# Patient Record
Sex: Female | Born: 1985
Health system: Southern US, Community
[De-identification: ages and names within clinical notes are randomized; demographics above are authoritative.]

## PROBLEM LIST (undated history)

## (undated) ENCOUNTER — Inpatient Hospital Stay (HOSPITAL_COMMUNITY): Payer: Self-pay

## (undated) DIAGNOSIS — D509 Iron deficiency anemia, unspecified: Secondary | ICD-10-CM

## (undated) DIAGNOSIS — O4400 Placenta previa specified as without hemorrhage, unspecified trimester: Secondary | ICD-10-CM

## (undated) DIAGNOSIS — Z8619 Personal history of other infectious and parasitic diseases: Secondary | ICD-10-CM

---
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
## 2007-06-06 ENCOUNTER — Emergency Department (HOSPITAL_COMMUNITY): Admission: EM | Admit: 2007-06-06 | Discharge: 2007-06-06 | Payer: Self-pay | Admitting: Emergency Medicine

## 2007-07-12 ENCOUNTER — Other Ambulatory Visit: Admission: RE | Admit: 2007-07-12 | Discharge: 2007-07-12 | Payer: Self-pay | Admitting: Obstetrics and Gynecology

## 2007-08-25 ENCOUNTER — Inpatient Hospital Stay (HOSPITAL_COMMUNITY): Admission: AD | Admit: 2007-08-25 | Discharge: 2007-08-25 | Payer: Self-pay | Admitting: Obstetrics and Gynecology

## 2007-09-08 ENCOUNTER — Inpatient Hospital Stay (HOSPITAL_COMMUNITY): Admission: AD | Admit: 2007-09-08 | Discharge: 2007-09-08 | Payer: Self-pay | Admitting: Obstetrics and Gynecology

## 2007-11-06 ENCOUNTER — Inpatient Hospital Stay (HOSPITAL_COMMUNITY): Admission: AD | Admit: 2007-11-06 | Discharge: 2007-11-19 | Payer: Self-pay | Admitting: *Deleted

## 2007-12-10 ENCOUNTER — Inpatient Hospital Stay (HOSPITAL_COMMUNITY): Admission: AD | Admit: 2007-12-10 | Discharge: 2007-12-10 | Payer: Self-pay | Admitting: Obstetrics and Gynecology

## 2008-09-26 ENCOUNTER — Other Ambulatory Visit: Admission: RE | Admit: 2008-09-26 | Discharge: 2008-09-26 | Payer: Self-pay | Admitting: Family Medicine

## 2009-01-10 ENCOUNTER — Emergency Department (HOSPITAL_BASED_OUTPATIENT_CLINIC_OR_DEPARTMENT_OTHER): Admission: EM | Admit: 2009-01-10 | Discharge: 2009-01-10 | Payer: Self-pay | Admitting: Emergency Medicine

## 2009-02-07 ENCOUNTER — Emergency Department (HOSPITAL_COMMUNITY): Admission: EM | Admit: 2009-02-07 | Discharge: 2009-02-07 | Payer: Self-pay | Admitting: Family Medicine

## 2009-03-13 ENCOUNTER — Emergency Department (HOSPITAL_BASED_OUTPATIENT_CLINIC_OR_DEPARTMENT_OTHER): Admission: EM | Admit: 2009-03-13 | Discharge: 2009-03-13 | Payer: Self-pay | Admitting: Emergency Medicine

## 2009-07-06 ENCOUNTER — Emergency Department (HOSPITAL_COMMUNITY): Admission: EM | Admit: 2009-07-06 | Discharge: 2009-07-06 | Payer: Self-pay | Admitting: Emergency Medicine

## 2009-07-09 ENCOUNTER — Emergency Department (HOSPITAL_COMMUNITY): Admission: EM | Admit: 2009-07-09 | Discharge: 2009-07-09 | Payer: Self-pay | Admitting: Emergency Medicine

## 2010-08-27 LAB — URINALYSIS, ROUTINE W REFLEX MICROSCOPIC
Bilirubin Urine: NEGATIVE
Glucose, UA: NEGATIVE mg/dL
Leukocytes, UA: NEGATIVE
Protein, ur: NEGATIVE mg/dL
Urobilinogen, UA: 2 mg/dL — ABNORMAL HIGH (ref 0.0–1.0)
pH: 7 (ref 5.0–8.0)

## 2010-08-27 LAB — URINE MICROSCOPIC-ADD ON

## 2010-09-05 ENCOUNTER — Inpatient Hospital Stay (INDEPENDENT_AMBULATORY_CARE_PROVIDER_SITE_OTHER)
Admission: RE | Admit: 2010-09-05 | Discharge: 2010-09-05 | Disposition: A | Discharge status: Home / Self Care | Payer: Medicaid Other | Source: Ambulatory Visit | Attending: Emergency Medicine | Admitting: Emergency Medicine

## 2010-09-05 DIAGNOSIS — N760 Acute vaginitis: Secondary | ICD-10-CM

## 2010-09-05 LAB — POCT URINALYSIS DIP (DEVICE)
Ketones, ur: NEGATIVE mg/dL
Nitrite: NEGATIVE
Protein, ur: NEGATIVE mg/dL
pH: 5.5 (ref 5.0–8.0)

## 2010-09-05 LAB — WET PREP, GENITAL
Trich, Wet Prep: NONE SEEN
Yeast Wet Prep HPF POC: NONE SEEN

## 2010-09-05 LAB — POCT PREGNANCY, URINE: Preg Test, Ur: NEGATIVE

## 2010-09-11 LAB — PREGNANCY, URINE: Preg Test, Ur: NEGATIVE

## 2010-09-11 LAB — GC/CHLAMYDIA PROBE AMP, GENITAL: Chlamydia, DNA Probe: NEGATIVE

## 2010-09-12 LAB — URINE CULTURE: Culture: NO GROWTH

## 2010-09-12 LAB — POCT URINALYSIS DIP (DEVICE)
Nitrite: NEGATIVE
Protein, ur: NEGATIVE mg/dL
Urobilinogen, UA: 0.2 mg/dL (ref 0.0–1.0)
pH: 6.5 (ref 5.0–8.0)

## 2010-09-12 LAB — GC/CHLAMYDIA PROBE AMP, GENITAL: Chlamydia, DNA Probe: NEGATIVE

## 2010-09-12 LAB — WET PREP, GENITAL: Clue Cells Wet Prep HPF POC: NONE SEEN

## 2010-10-21 NOTE — Discharge Summary (Signed)
NAMEAUTYMN, Audrey Dougherty                ACCOUNT NO.:  [id number]   MEDICAL RECORD NO.:  [id number]           PATIENT TYPE:   LOCATION:                                 FACILITY:   PHYSICIAN:  Charles A. Delcambre, MDDATE OF BIRTH:  June 07, 1986   DATE OF ADMISSION:  11/07/2007  DATE OF DISCHARGE:  11/19/2007                               DISCHARGE SUMMARY   PRIMARY DISCHARGE DIAGNOSES:  1. Intrauterine pregnancy, 28 weeks 3 days.  2. Placenta previa, one significant bleed.   PROCEDURE:  Hospitalization and bed rest.   HISTORY AND PHYSICAL:  As written on the chart.   LABORATORY DATA:  1-hour glucose 136, hemoglobin 12.0, and hematocrit  35.8. GC/chlamydia negative.   DISPOSITION:  The patient was discharged home per her request and my  consent after strict counseling as she has not had any bleeding for 14  days and has been at bed rest.  She agrees to be on bed rest at home,  with strict precautions to come to the hospital with any bleeding  whatsoever or contraction onset greater than 2 per hour.   HOSPITAL COURSE:  She is instructed to bed rest with bathroom privileges  at home, strictly so, as she had been treated here in the hospital.  She  was kept on monitor for several days with a fetal heart rate and then  this was changed to 30 minutes per shift.  Each shift has had a reactive  fetal heart rate and therefore she was allowed to have one wheelchair  ride per day in-house and then 1 to 2 wheelchair rides per house for the  last several days.  She is up to the bathroom, has had no bleeding, and  with her request and my consent she is discharged home today on bed rest  and pelvic rest.      Charles A. Sydnee Cabal, MD  Electronically Signed     CAD/MEDQ  D:  11/19/2007  T:  11/20/2007  Job:  [id number]

## 2010-12-16 ENCOUNTER — Emergency Department (HOSPITAL_COMMUNITY)
Admission: EM | Admit: 2010-12-16 | Discharge: 2010-12-16 | Disposition: A | Discharge status: Home / Self Care | Payer: Medicaid Other | Attending: Emergency Medicine | Admitting: Emergency Medicine

## 2010-12-16 DIAGNOSIS — N764 Abscess of vulva: Secondary | ICD-10-CM | POA: Insufficient documentation

## 2011-03-02 LAB — URINALYSIS, ROUTINE W REFLEX MICROSCOPIC
Hgb urine dipstick: NEGATIVE
Nitrite: NEGATIVE
Specific Gravity, Urine: 1.01
Urobilinogen, UA: 0.2
pH: 7

## 2011-03-04 LAB — URINALYSIS, ROUTINE W REFLEX MICROSCOPIC
Nitrite: NEGATIVE
Protein, ur: NEGATIVE
Urobilinogen, UA: 0.2

## 2011-03-04 LAB — CBC
MCHC: 33.5
RBC: 4.19
RDW: 12.9

## 2011-03-04 LAB — TYPE AND SCREEN
ABO/RH(D): O POS
Antibody Screen: NEGATIVE

## 2011-03-04 LAB — DIFFERENTIAL
Basophils Absolute: 0
Basophils Relative: 0
Lymphocytes Relative: 16
Neutro Abs: 11.7 — ABNORMAL HIGH
Neutrophils Relative %: 72

## 2011-03-04 LAB — RPR: RPR Ser Ql: NONREACTIVE

## 2011-03-05 LAB — URINALYSIS, ROUTINE W REFLEX MICROSCOPIC
Bilirubin Urine: NEGATIVE
Nitrite: NEGATIVE
Specific Gravity, Urine: 1.015
Urobilinogen, UA: 0.2

## 2011-03-05 LAB — TYPE AND SCREEN
ABO/RH(D): O POS
ABO/RH(D): O POS
ABO/RH(D): O POS
ABO/RH(D): O POS
Antibody Screen: NEGATIVE
Antibody Screen: NEGATIVE

## 2011-03-05 LAB — CBC
MCHC: 33.4
RBC: 4.12
RDW: 13.1

## 2011-03-05 LAB — GC/CHLAMYDIA PROBE AMP, URINE
Chlamydia, Swab/Urine, PCR: NEGATIVE
GC Probe Amp, Urine: NEGATIVE

## 2011-03-05 LAB — HEMOGLOBIN AND HEMATOCRIT, BLOOD
HCT: 34.1 — ABNORMAL LOW
Hemoglobin: 11.7 — ABNORMAL LOW

## 2011-03-13 LAB — GC/CHLAMYDIA PROBE AMP, GENITAL
Chlamydia, DNA Probe: NEGATIVE
GC Probe Amp, Genital: NEGATIVE

## 2011-03-13 LAB — URINALYSIS, ROUTINE W REFLEX MICROSCOPIC
Bilirubin Urine: NEGATIVE
Glucose, UA: NEGATIVE
Hgb urine dipstick: NEGATIVE
Ketones, ur: NEGATIVE
Protein, ur: NEGATIVE

## 2011-03-13 LAB — CBC
Hemoglobin: 12.5
RBC: 4.44
WBC: 11 — ABNORMAL HIGH

## 2011-03-13 LAB — DIFFERENTIAL
Lymphocytes Relative: 15
Monocytes Absolute: 0.6
Monocytes Relative: 6
Neutro Abs: 8.3 — ABNORMAL HIGH

## 2011-03-13 LAB — WET PREP, GENITAL

## 2011-03-13 LAB — ABO/RH: ABO/RH(D): O POS

## 2011-05-06 ENCOUNTER — Encounter: Payer: Self-pay | Admitting: *Deleted

## 2011-05-06 ENCOUNTER — Emergency Department (HOSPITAL_BASED_OUTPATIENT_CLINIC_OR_DEPARTMENT_OTHER)
Admission: EM | Admit: 2011-05-06 | Discharge: 2011-05-06 | Disposition: A | Discharge status: Home / Self Care | Payer: Medicaid Other | Attending: Emergency Medicine | Admitting: Emergency Medicine

## 2011-05-06 ENCOUNTER — Emergency Department (INDEPENDENT_AMBULATORY_CARE_PROVIDER_SITE_OTHER): Payer: Medicaid Other

## 2011-05-06 ENCOUNTER — Emergency Department (HOSPITAL_COMMUNITY)
Admission: EM | Admit: 2011-05-06 | Discharge: 2011-05-06 | Discharge status: Absent without leave | Payer: Self-pay | Source: Home / Self Care

## 2011-05-06 DIAGNOSIS — K5289 Other specified noninfective gastroenteritis and colitis: Secondary | ICD-10-CM | POA: Insufficient documentation

## 2011-05-06 DIAGNOSIS — R509 Fever, unspecified: Secondary | ICD-10-CM

## 2011-05-06 DIAGNOSIS — K529 Noninfective gastroenteritis and colitis, unspecified: Secondary | ICD-10-CM

## 2011-05-06 DIAGNOSIS — R05 Cough: Secondary | ICD-10-CM

## 2011-05-06 DIAGNOSIS — R197 Diarrhea, unspecified: Secondary | ICD-10-CM | POA: Insufficient documentation

## 2011-05-06 DIAGNOSIS — R112 Nausea with vomiting, unspecified: Secondary | ICD-10-CM | POA: Insufficient documentation

## 2011-05-06 DIAGNOSIS — R079 Chest pain, unspecified: Secondary | ICD-10-CM

## 2011-05-06 DIAGNOSIS — R059 Cough, unspecified: Secondary | ICD-10-CM | POA: Insufficient documentation

## 2011-05-06 LAB — DIFFERENTIAL
Band Neutrophils: 0 % (ref 0–10)
Eosinophils Absolute: 0.6 10*3/uL (ref 0.0–0.7)
Eosinophils Relative: 8 % — ABNORMAL HIGH (ref 0–5)
Lymphocytes Relative: 33 % (ref 12–46)
Lymphs Abs: 2.4 10*3/uL (ref 0.7–4.0)
Metamyelocytes Relative: 0 %
Monocytes Absolute: 0.6 10*3/uL (ref 0.1–1.0)
Monocytes Relative: 8 % (ref 3–12)
nRBC: 0 /100 WBC

## 2011-05-06 LAB — URINALYSIS, ROUTINE W REFLEX MICROSCOPIC
Bilirubin Urine: NEGATIVE
Hgb urine dipstick: NEGATIVE
Ketones, ur: NEGATIVE mg/dL
Nitrite: NEGATIVE
Protein, ur: NEGATIVE mg/dL
Specific Gravity, Urine: 1.03 (ref 1.005–1.030)
Urobilinogen, UA: 1 mg/dL (ref 0.0–1.0)

## 2011-05-06 LAB — URINE MICROSCOPIC-ADD ON

## 2011-05-06 LAB — CBC
HCT: 38.6 % (ref 36.0–46.0)
MCV: 79.4 fL (ref 78.0–100.0)
Platelets: 234 10*3/uL (ref 150–400)
RBC: 4.86 MIL/uL (ref 3.87–5.11)
WBC: 7.2 10*3/uL (ref 4.0–10.5)

## 2011-05-06 LAB — BASIC METABOLIC PANEL
BUN: 12 mg/dL (ref 6–23)
Calcium: 9.1 mg/dL (ref 8.4–10.5)
GFR calc Af Amer: >90 mL/min (ref >90)
GFR calc non Af Amer: >90 mL/min (ref >90)
Glucose, Bld: 93 mg/dL (ref 70–99)
Potassium: 4.3 mEq/L (ref 3.5–5.1)

## 2011-05-06 LAB — PREGNANCY, URINE: Preg Test, Ur: NEGATIVE

## 2011-05-06 MED ORDER — ONDANSETRON HCL 4 MG/2ML IJ SOLN
4.0000 mg | Freq: Once | INTRAMUSCULAR | Status: AC
  Administered 2011-05-06: 4 mg via INTRAVENOUS
  Filled 2011-05-06: qty 2

## 2011-05-06 MED ORDER — PROMETHAZINE HCL 25 MG PO TABS: 25.0000 mg | ORAL_TABLET | Freq: Four times a day (QID) | ORAL | Status: AC | PRN

## 2011-05-06 MED ORDER — SODIUM CHLORIDE 0.9 % IV SOLN
Freq: Once | INTRAVENOUS | Status: AC
  Administered 2011-05-06: 18:00:00 via INTRAVENOUS

## 2011-05-06 MED ORDER — DIPHENOXYLATE-ATROPINE 2.5-0.025 MG PO TABS: ORAL_TABLET | ORAL | Status: DC

## 2011-05-06 NOTE — ED Notes (Signed)
Pt c/o fever, vomiting, diarrhea and cough productive of greenish-brown sputum since Friday.

## 2011-05-06 NOTE — ED Provider Notes (Signed)
History     CSN: [id number] Arrival date & time: 05/06/2011  4:28 PM   First MD Initiated Contact with Patient 05/06/11 1704      Chief Complaint  Patient presents with  . Emesis  . Diarrhea  . Cough    (Consider location/radiation/quality/duration/timing/severity/associated sxs/prior treatment) HPI Comments: The patient is a 25 is been sick for 4 or 5 days with vomiting and diarrhea and a cough. She has had a subjective sensation of fever she has tried Tylenol, TheraFlu, and NyQuil without relief. He therefore seeks evaluation. She says her son had bronchitis about a week ago. That is her only exposure to someone ill.  Patient is a 24 y.o. female presenting with vomiting, diarrhea, and cough. The history is provided by the patient. No language interpreter was used.  Emesis  This is a new problem. The current episode started more than 2 days ago. The problem occurs 5 to 10 times per day. The problem has not changed since onset.Maximum temperature: She has felt feverish but did not measure her temperature. Associated symptoms include cough, diarrhea, a fever and myalgias.  Diarrhea The primary symptoms include fever, nausea, vomiting, diarrhea and myalgias.  Cough Associated symptoms include rhinorrhea, sore throat and myalgias.    History reviewed. No pertinent past medical history.  Past Surgical History  Procedure Date  . Cesarean section     No family history on file.  History  Substance Use Topics  . Smoking status: Never Smoker   . Smokeless tobacco: Not on file  . Alcohol Use: No    OB History    Grav Para Term Preterm Abortions TAB SAB Ect Mult Living                  Review of Systems  Constitutional: Positive for fever.  HENT: Positive for sore throat and rhinorrhea.   Respiratory: Positive for cough.   Cardiovascular: Negative.   Gastrointestinal: Positive for nausea, vomiting and diarrhea.  Genitourinary: Negative.  Menstrual problem: her last  menstrual period was 04/10/2011.  Musculoskeletal: Positive for myalgias.  Skin: Negative.   Neurological: Negative.   Psychiatric/Behavioral: Negative.     Allergies  Review of patient's allergies indicates no known allergies.  Home Medications   Current Outpatient Rx  Name Route Sig Dispense Refill  . DIPHENOXYLATE-ATROPINE 2.5-0.025 MG PO TABS  Take 2 tablets every 4 hours as needed for diarrhea. Discontinue the medication when the diarrhea stops. 20 tablet 0  . PROMETHAZINE HCL 25 MG PO TABS Oral Take 1 tablet (25 mg total) by mouth every 6 (six) hours as needed for nausea. 12 tablet 0    BP 115/74  Pulse 75  Temp(Src) 98.3 F (36.8 C) (Oral)  Resp 18  Ht 5\' 5"  (1.651 m)  Wt 127 lb (57.607 kg)  BMI 21.13 kg/m2  SpO2 100%  LMP 04/10/2011  Physical Exam  Constitutional: She is oriented to person, place, and time. She appears well-developed and well-nourished. Distressed: mild distress complaining of myalgias, nausea and vomiting and diarrhea.  HENT:  Head: Normocephalic and atraumatic.  Right Ear: External ear normal.  Left Ear: External ear normal.  Mouth/Throat: Oropharyngeal exudate: she has mild redness of her throat.  Eyes: Conjunctivae and EOM are normal. Pupils are equal, round, and reactive to light.  Neck: Normal range of motion. Neck supple.  Cardiovascular: Normal rate, regular rhythm and normal heart sounds.   Pulmonary/Chest: Effort normal and breath sounds normal.  Abdominal: Soft. Bowel sounds are normal. She  exhibits no distension. There is no tenderness.  Musculoskeletal: Normal range of motion.  Neurological: She is alert and oriented to person, place, and time.       No sensory or motor deficit.  Skin: Skin is warm and dry.  Psychiatric: She has a normal mood and affect. Her behavior is normal.    ED Course  Procedures (including critical care time)  Labs Reviewed  DIFFERENTIAL - Abnormal; Notable for the following:    Eosinophils Relative 8  (*)    All other components within normal limits  URINALYSIS, ROUTINE W REFLEX MICROSCOPIC - Abnormal; Notable for the following:    APPearance CLOUDY (*)    Leukocytes, UA SMALL (*)    All other components within normal limits  URINE MICROSCOPIC-ADD ON - Abnormal; Notable for the following:    Squamous Epithelial / LPF FEW (*)    All other components within normal limits  CBC  BASIC METABOLIC PANEL  PREGNANCY, URINE  URINE CULTURE   Dg Chest 2 View  05/06/2011  *RADIOLOGY REPORT*  Clinical Data: Fever and cough  CHEST - 2 VIEW  Comparison: None.  Findings: Cardiomediastinal silhouette is within normal limits. The lungs are clear. No pleural effusion.  No pneumothorax.  No acute osseous abnormality.  IMPRESSION: Normal chest.  Original Report Authenticated By: Harrel Lemon, M.D.   7:06 PM I advised the patient that her symptoms are slightly other is a viral illness. We will give her a liter of IV fluid and a dose of Zofran intravenously, to help hydrate her. At home, she can take Phenergan 25 mg every 4 hours as needed for nausea, and Lomotil, 2 tablets every 4 hours as needed for diarrhea.  7:06 PM Feeling better, and therefore released with prescriptions as above.   1. Gastroenteritis          Carleene Cooper III, MD 05/06/11 [phone]

## 2011-05-06 NOTE — ED Notes (Signed)
Po liquid given to pt

## 2011-05-08 LAB — URINE CULTURE: Culture: NO GROWTH

## 2011-05-26 ENCOUNTER — Emergency Department (HOSPITAL_BASED_OUTPATIENT_CLINIC_OR_DEPARTMENT_OTHER)
Admission: EM | Admit: 2011-05-26 | Discharge: 2011-05-26 | Disposition: A | Discharge status: Home / Self Care | Payer: Self-pay | Attending: Emergency Medicine | Admitting: Emergency Medicine

## 2011-05-26 ENCOUNTER — Emergency Department (INDEPENDENT_AMBULATORY_CARE_PROVIDER_SITE_OTHER): Payer: Self-pay

## 2011-05-26 ENCOUNTER — Encounter (HOSPITAL_BASED_OUTPATIENT_CLINIC_OR_DEPARTMENT_OTHER): Payer: Self-pay | Admitting: Emergency Medicine

## 2011-05-26 DIAGNOSIS — S0003XA Contusion of scalp, initial encounter: Secondary | ICD-10-CM | POA: Insufficient documentation

## 2011-05-26 DIAGNOSIS — IMO0002 Reserved for concepts with insufficient information to code with codable children: Secondary | ICD-10-CM | POA: Insufficient documentation

## 2011-05-26 DIAGNOSIS — R6884 Jaw pain: Secondary | ICD-10-CM

## 2011-05-26 DIAGNOSIS — Y92410 Unspecified street and highway as the place of occurrence of the external cause: Secondary | ICD-10-CM | POA: Insufficient documentation

## 2011-05-26 DIAGNOSIS — S60512A Abrasion of left hand, initial encounter: Secondary | ICD-10-CM

## 2011-05-26 DIAGNOSIS — F17200 Nicotine dependence, unspecified, uncomplicated: Secondary | ICD-10-CM | POA: Insufficient documentation

## 2011-05-26 DIAGNOSIS — S0083XA Contusion of other part of head, initial encounter: Secondary | ICD-10-CM

## 2011-05-26 DIAGNOSIS — R221 Localized swelling, mass and lump, neck: Secondary | ICD-10-CM

## 2011-05-26 DIAGNOSIS — R220 Localized swelling, mass and lump, head: Secondary | ICD-10-CM

## 2011-05-26 MED ORDER — HYDROCODONE-ACETAMINOPHEN 5-325 MG PO TABS: 2.0000 | ORAL_TABLET | ORAL | Status: AC | PRN

## 2011-05-26 NOTE — ED Provider Notes (Signed)
Medical screening examination/treatment/procedure(s) were performed by non-physician practitioner and as supervising physician I was immediately available for consultation/collaboration.   Lyanne Co, MD 05/26/11 1537
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
## 2011-05-29 ENCOUNTER — Encounter (HOSPITAL_BASED_OUTPATIENT_CLINIC_OR_DEPARTMENT_OTHER): Payer: Self-pay | Admitting: *Deleted

## 2011-05-29 ENCOUNTER — Emergency Department (HOSPITAL_BASED_OUTPATIENT_CLINIC_OR_DEPARTMENT_OTHER)
Admission: EM | Admit: 2011-05-29 | Discharge: 2011-05-29 | Disposition: A | Discharge status: Home / Self Care | Payer: Medicaid Other | Attending: Emergency Medicine | Admitting: Emergency Medicine

## 2011-05-29 DIAGNOSIS — R51 Headache: Secondary | ICD-10-CM | POA: Insufficient documentation

## 2011-05-29 MED ORDER — IBUPROFEN 600 MG PO TABS: 600.0000 mg | ORAL_TABLET | Freq: Four times a day (QID) | ORAL | Status: AC | PRN

## 2011-05-29 MED ORDER — IBUPROFEN 800 MG PO TABS
800.0000 mg | ORAL_TABLET | Freq: Once | ORAL | Status: AC
  Administered 2011-05-29: 800 mg via ORAL
  Filled 2011-05-29: qty 1

## 2011-05-29 MED ORDER — AMOXICILLIN-POT CLAVULANATE 875-125 MG PO TABS: 1.0000 | ORAL_TABLET | Freq: Two times a day (BID) | ORAL | Status: AC
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
## 2011-06-26 ENCOUNTER — Emergency Department (HOSPITAL_BASED_OUTPATIENT_CLINIC_OR_DEPARTMENT_OTHER)
Admission: EM | Admit: 2011-06-26 | Discharge: 2011-06-26 | Disposition: A | Discharge status: Home / Self Care | Payer: Self-pay | Attending: Emergency Medicine | Admitting: Emergency Medicine

## 2011-06-26 ENCOUNTER — Encounter (HOSPITAL_BASED_OUTPATIENT_CLINIC_OR_DEPARTMENT_OTHER): Payer: Self-pay

## 2011-06-26 DIAGNOSIS — L02219 Cutaneous abscess of trunk, unspecified: Secondary | ICD-10-CM | POA: Insufficient documentation

## 2011-06-26 DIAGNOSIS — L03319 Cellulitis of trunk, unspecified: Secondary | ICD-10-CM | POA: Insufficient documentation

## 2011-06-26 DIAGNOSIS — L0291 Cutaneous abscess, unspecified: Secondary | ICD-10-CM

## 2011-06-26 DIAGNOSIS — F17200 Nicotine dependence, unspecified, uncomplicated: Secondary | ICD-10-CM | POA: Insufficient documentation

## 2011-06-26 MED ORDER — SULFAMETHOXAZOLE-TRIMETHOPRIM 800-160 MG PO TABS: 1.0000 | ORAL_TABLET | Freq: Two times a day (BID) | ORAL | Status: AC

## 2011-06-26 MED ORDER — LIDOCAINE HCL 2 % IJ SOLN
INTRAMUSCULAR | Status: AC
  Administered 2011-06-26: 20 mg
  Filled 2011-06-26: qty 1

## 2011-06-26 NOTE — ED Notes (Signed)
Pt reports a "boil" in left groin x 3 days.

## 2011-06-26 NOTE — ED Notes (Signed)
Dr. Judd Lien at bedside preparing for I&D.

## 2011-06-26 NOTE — ED Notes (Signed)
I placed the I & D tray in the room, ready for the dr. The patient is undressed and in a gown.
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
## 2011-10-06 ENCOUNTER — Inpatient Hospital Stay (HOSPITAL_COMMUNITY)
Admission: AD | Admit: 2011-10-06 | Discharge: 2011-10-06 | Disposition: A | Discharge status: Home / Self Care | Payer: Medicaid Other | Source: Ambulatory Visit | Attending: Obstetrics & Gynecology | Admitting: Obstetrics & Gynecology

## 2011-10-06 ENCOUNTER — Inpatient Hospital Stay (HOSPITAL_COMMUNITY): Payer: Medicaid Other

## 2011-10-06 ENCOUNTER — Encounter (HOSPITAL_COMMUNITY): Payer: Self-pay | Admitting: *Deleted

## 2011-10-06 DIAGNOSIS — O209 Hemorrhage in early pregnancy, unspecified: Secondary | ICD-10-CM | POA: Insufficient documentation

## 2011-10-06 DIAGNOSIS — R109 Unspecified abdominal pain: Secondary | ICD-10-CM | POA: Insufficient documentation

## 2011-10-06 LAB — URINE MICROSCOPIC-ADD ON

## 2011-10-06 LAB — CBC
MCV: 82.1 fL (ref 78.0–100.0)
Platelets: 309 10*3/uL (ref 150–400)
RBC: 4.86 MIL/uL (ref 3.87–5.11)
RDW: 13.3 % (ref 11.5–15.5)
WBC: 16 10*3/uL — ABNORMAL HIGH (ref 4.0–10.5)

## 2011-10-06 LAB — URINALYSIS, ROUTINE W REFLEX MICROSCOPIC
Bilirubin Urine: NEGATIVE
Leukocytes, UA: NEGATIVE
Protein, ur: NEGATIVE mg/dL

## 2011-10-06 LAB — WET PREP, GENITAL: Trich, Wet Prep: NONE SEEN

## 2011-10-06 LAB — POCT PREGNANCY, URINE: Preg Test, Ur: NEGATIVE

## 2011-10-06 LAB — HCG, QUANTITATIVE, PREGNANCY: hCG, Beta Chain, Quant, S: 45 m[IU]/mL — ABNORMAL HIGH (ref <5)

## 2011-10-06 NOTE — Discharge Instructions (Signed)
Vaginal Bleeding During Pregnancy, First Trimester A small amount of bleeding (spotting) is relatively common in early pregnancy. It usually stops on its own. There are many causes for bleeding or spotting in early pregnancy. Some bleeding may be related to the pregnancy and some may not. Cramping with the bleeding is more serious and concerning. Tell your caregiver if you have any vaginal bleeding.  CAUSES   It is normal in most cases.   The pregnancy ends (miscarriage).   The pregnancy may end (threatened miscarriage).   Infection or inflammation of the cervix.   Growths (polyps) on the cervix.   Pregnancy happens outside of the uterus and in a fallopian tube (tubal pregnancy).   Many tiny cysts in the uterus instead of pregnancy tissue (molar pregnancy).  SYMPTOMS  Vaginal bleeding or spotting with or without cramps. DIAGNOSIS  To evaluate the pregnancy, your caregiver may:  Do a pelvic exam.   Take blood tests.   Do an ultrasound.  It is very important to follow your caregiver's instructions.  TREATMENT   Evaluation of the pregnancy with blood tests and ultrasound.   Bed rest (getting up to use the bathroom only).   Rho-gam immunization if the mother is Rh negative and the father is Rh positive.  HOME CARE INSTRUCTIONS   If your caregiver orders bed rest, you may need to make arrangements for the care of other children and for other responsibilities. However, your caregiver may allow you to continue light activity.   Keep track of the number of pads you use each day, how often you change pads and how soaked (saturated) they are. Write this down.   Do not use tampons. Do not douche.   Do not have sexual intercourse or orgasms until approved by your physician.   Save any tissue that you pass for your caregiver to see.   Take medicine for cramps only with your caregiver's permission.   Do not take aspirin because it can make you bleed.  SEEK IMMEDIATE MEDICAL CARE  IF:   You experience severe cramps in your stomach, back or belly (abdomen).   You have an oral temperature above 102 F (38.9 C), not controlled by medicine.   You pass large clots or tissue.   Your bleeding increases or you become light-headed, weak or have fainting episodes.   You develop chills.   You are leaking or have a gush of fluid from your vagina.   You pass out while having a bowel movement. That may mean you have a ruptured tubal pregnancy.  Document Released: 03/04/2005 Document Revised: 05/14/2011 Document Reviewed: 09/13/2008 [facility] Patient Information 2012 [place], [place].Vaginal Bleeding During Pregnancy, First Trimester A small amount of bleeding (spotting) is relatively common in early pregnancy. It usually stops on its own. There are many causes for bleeding or spotting in early pregnancy. Some bleeding may be related to the pregnancy and some may not. Cramping with the bleeding is more serious and concerning. Tell your caregiver if you have any vaginal bleeding.  CAUSES   It is normal in most cases.   The pregnancy ends (miscarriage).   The pregnancy may end (threatened miscarriage).   Infection or inflammation of the cervix.   Growths (polyps) on the cervix.   Pregnancy happens outside of the uterus and in a fallopian tube (tubal pregnancy).   Many tiny cysts in the uterus instead of pregnancy tissue (molar pregnancy).  SYMPTOMS  Vaginal bleeding or spotting with or without cramps. DIAGNOSIS  To  evaluate the pregnancy, your caregiver may:  Do a pelvic exam.   Take blood tests.   Do an ultrasound.  It is very important to follow your caregiver's instructions.  TREATMENT   Evaluation of the pregnancy with blood tests and ultrasound.   Bed rest (getting up to use the bathroom only).   Rho-gam immunization if the mother is Rh negative and the father is Rh positive.  HOME CARE INSTRUCTIONS   If your caregiver orders bed rest, you may need to  make arrangements for the care of other children and for other responsibilities. However, your caregiver may allow you to continue light activity.   Keep track of the number of pads you use each day, how often you change pads and how soaked (saturated) they are. Write this down.   Do not use tampons. Do not douche.   Do not have sexual intercourse or orgasms until approved by your physician.   Save any tissue that you pass for your caregiver to see.   Take medicine for cramps only with your caregiver's permission.   Do not take aspirin because it can make you bleed.  SEEK IMMEDIATE MEDICAL CARE IF:   You experience severe cramps in your stomach, back or belly (abdomen).   You have an oral temperature above 102 F (38.9 C), not controlled by medicine.   You pass large clots or tissue.   Your bleeding increases or you become light-headed, weak or have fainting episodes.   You develop chills.   You are leaking or have a gush of fluid from your vagina.   You pass out while having a bowel movement. That may mean you have a ruptured tubal pregnancy.  Document Released: 03/04/2005 Document Revised: 05/14/2011 Document Reviewed: 09/13/2008 [facility] Patient Information 2012 [place], [place].

## 2011-10-06 NOTE — MAU Note (Signed)
Patient states she has had a positive home pregnancy test 2 days ago. Started having mild cramping and bright red spotting today.

## 2011-10-06 NOTE — MAU Provider Note (Signed)
History     CSN: [id number]  Arrival date and time: 10/06/11 1711   None     Chief Complaint  Patient presents with  . Possible Pregnancy  . Abdominal Cramping  . Vaginal Bleeding   HPI  Pt is [redacted]w[redacted]d pregnant with LMP 09/02/2011.  She had a positive HPT on 10/04/2011 and started bleeding yesterday like a period.  She has had some cramping but none now.  She denies UTI symptoms, vaginal discharge, constipation or diarrhea.  No past medical history on file.  Past Surgical History  Procedure Date  . Cesarean section     No family history on file.  History  Substance Use Topics  . Smoking status: Current Some Day Smoker -- 0.5 packs/day  . Smokeless tobacco: Not on file  . Alcohol Use: Yes    Allergies: No Known Allergies  Prescriptions prior to admission  Medication Sig Dispense Refill  . diphenoxylate-atropine (LOMOTIL) 2.5-0.025 MG per tablet Take 2 tablets every 4 hours as needed for diarrhea. Discontinue the medication when the diarrhea stops.  20 tablet  0    Review of Systems  Constitutional: Negative for fever and chills.  Gastrointestinal: Positive for abdominal pain. Negative for nausea, vomiting, diarrhea and constipation.  Genitourinary: Negative for dysuria, urgency and frequency.   Physical Exam   Blood pressure 124/69, pulse 73, temperature 98.8 F (37.1 C), temperature source Oral, resp. rate 16, height 5\' 6"  (1.676 m), weight 136 lb 6.4 oz (61.871 kg), last menstrual period 09/02/2011, SpO2 100.00%.  Physical Exam  Nursing note and vitals reviewed. Constitutional: She is oriented to person, place, and time. She appears well-developed and well-nourished.  HENT:  Head: Normocephalic.  Eyes: Pupils are equal, round, and reactive to light.  Neck: Normal range of motion. Neck supple.  Cardiovascular: Normal rate.   Respiratory: Effort normal.  GI: Soft. She exhibits no distension. There is no tenderness. There is no rebound and no guarding.    Genitourinary:       Mod-large amount of bright red blood in vault; cervix clean, NT; uterus NSSC NT adnexal without palpable enlargement or tenderness  Musculoskeletal: Normal range of motion.  Neurological: She is alert and oriented to person, place, and time.  Skin: Skin is warm and dry.  Psychiatric: She has a normal mood and affect.    MAU Course  Procedures GC/chlamydia and wet prep pending Will review with pt on return  Clinical Data: Bleeding, pregnant; LMP 09/02/2011; quantitative  beta HCG = 45  OBSTETRIC <14 WK [place] AND TRANSVAGINAL OB US  Technique: Both transabdominal and transvaginal ultrasound  examinations were performed for complete evaluation of the  gestation as well as the maternal uterus, adnexal regions, and  pelvic cul-de-sac. Transvaginal technique was performed to assess  early pregnancy.  Comparison: 06/06/2007  Intrauterine gestational sac: Not identified  Yolk sac: N/A  Embryo: N/A  Cardiac Activity: N/A  Heart Rate: N/A bpm  Maternal uterus/adnexae:  Uterus normal in size with a normal-appearing endometrial complex 4  mm thick.  No endometrial fluid or focal fluid collection.  Left ovary normal size and morphology, 1.5 x 3.5 x 1.8 cm.  Right ovary normal size and morphology, 3.7 x 1.8 x 1.9 cm.  No adnexal masses or free pelvic fluid.  IMPRESSION:  No intrauterine gestation identified.  With a quantitative beta HCG value of 45, an early intrauterine  pregnancy would not be expected to be visualized.  Differential diagnosis therefore includes early intrauterine  pregnancy too early  to be visualized, ectopic pregnancy, and  spontaneous abortion.  Serial quantitative beta HCG and/or follow-up ultrasound  recommended to definitively exclude ectopic pregnancy.  Original Report Authenticated By: Lollie Marrow, M.D. Assessment and Plan  Bleeding in early pregnancy F/u for repeat Beta HCG on Friday Oct 09, 2011  Audrey Dougherty 10/06/2011, 7:30 PM

## 2011-10-07 LAB — GC/CHLAMYDIA PROBE AMP, GENITAL: Chlamydia, DNA Probe: NEGATIVE

## 2011-10-09 ENCOUNTER — Encounter (HOSPITAL_COMMUNITY): Payer: Self-pay

## 2011-10-09 ENCOUNTER — Inpatient Hospital Stay (HOSPITAL_COMMUNITY)
Admission: AD | Admit: 2011-10-09 | Discharge: 2011-10-09 | Disposition: A | Discharge status: Home / Self Care | Payer: Self-pay | Source: Ambulatory Visit | Attending: Obstetrics & Gynecology | Admitting: Obstetrics & Gynecology

## 2011-10-09 DIAGNOSIS — O039 Complete or unspecified spontaneous abortion without complication: Secondary | ICD-10-CM | POA: Insufficient documentation

## 2011-10-09 HISTORY — DX: Complete placenta previa nos or without hemorrhage, unspecified trimester: O44.00

## 2011-10-09 LAB — HCG, QUANTITATIVE, PREGNANCY: hCG, Beta Chain, Quant, S: 8 m[IU]/mL — ABNORMAL HIGH (ref <5)

## 2011-10-09 NOTE — MAU Provider Note (Signed)
Attestation of Attending Supervision of Advanced Practitioner: Evaluation and management procedures were performed by the [facility] Fellow/PA/CNM/NP under my supervision and collaboration. Chart reviewed, and agree with management and plan.  Jaynie Collins, M.D. 10/09/2011 7:40 PM

## 2011-10-09 NOTE — MAU Note (Signed)
Pt here for repeat bhcg levels, denies pain, still has scant amount of bleeding, lessened from 10/06/2011.

## 2011-10-09 NOTE — MAU Note (Signed)
Pt d/c'd by NFrazier, CNM while Epic system is down. Pt voices understanding of careplan.

## 2011-10-09 NOTE — MAU Provider Note (Signed)
  History     CSN: [id number]  Arrival date and time: 10/09/11 1058   None     Chief Complaint  Patient presents with  . Follow-up    HPI 26 y.o. G2P0101 at [redacted]w[redacted]d by LMP here for f/u quant. Seen with bleeding on 4/30, states it was like a period at that time, lighter now, no pain.      Past Medical History  Diagnosis Date  . Placenta previa   . Preterm labor     Past Surgical History  Procedure Date  . Cesarean section     Family History  Problem Relation Age of Onset  . Diabetes Paternal Grandmother   . Anesthesia problems Neg Hx   . Hypotension Neg Hx   . Malignant hyperthermia Neg Hx   . Pseudochol deficiency Neg Hx     History  Substance Use Topics  . Smoking status: Current Some Day Smoker -- 0.5 packs/day    Types: Cigarettes  . Smokeless tobacco: Never Used  . Alcohol Use: Yes    Allergies: No Known Allergies  No prescriptions prior to admission    Review of Systems  Constitutional: Negative.   Respiratory: Negative.   Cardiovascular: Negative.   Gastrointestinal: Negative for nausea, vomiting, abdominal pain, diarrhea and constipation.  Genitourinary: Negative for dysuria, urgency, frequency, hematuria and flank pain.       Positive for vaginal bleeding  Musculoskeletal: Negative.   Neurological: Negative.   Psychiatric/Behavioral: Negative.    Physical Exam   Blood pressure 113/74, pulse 70, temperature 97.6 F (36.4 C), temperature source Oral, resp. rate 18, height 5' 4.5" (1.638 m), weight 133 lb 2 oz (60.385 kg), last menstrual period 09/02/2011, SpO2 100.00%.  Physical Exam  Nursing note and vitals reviewed. Constitutional: She is oriented to person, place, and time. She appears well-developed and well-nourished. No distress.  Cardiovascular: Normal rate.   Respiratory: Effort normal.  GI: Soft. There is no tenderness.  Musculoskeletal: Normal range of motion.  Neurological: She is alert and oriented to person, place, and time.    Skin: Skin is warm and dry.  Psychiatric: She has a normal mood and affect.    MAU Course  Procedures  Results for orders placed during the hospital encounter of 10/09/11 (from the past 24 hour(s))  HCG, QUANTITATIVE, PREGNANCY     Status: Abnormal   Collection Time   10/09/11 11:10 AM      Component Value Range   hCG, Beta Chain, Quant, S 8 (*) <5 (mIU/mL)     Assessment and Plan  26 y.o. [id number]  SAB - f/u with PCP as needed Precautions rev'd  Pina Sirianni 10/09/2011, 2:05 PM
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
## 2014-04-09 ENCOUNTER — Encounter (HOSPITAL_COMMUNITY): Payer: Self-pay

## 2014-11-01 ENCOUNTER — Emergency Department (HOSPITAL_COMMUNITY)
Admission: EM | Admit: 2014-11-01 | Discharge: 2014-11-01 | Disposition: A | Discharge status: Home / Self Care | Payer: Medicaid Other | Attending: Emergency Medicine | Admitting: Emergency Medicine

## 2014-11-01 ENCOUNTER — Encounter (HOSPITAL_COMMUNITY): Payer: Self-pay | Admitting: Emergency Medicine

## 2014-11-01 DIAGNOSIS — Z720 Tobacco use: Secondary | ICD-10-CM | POA: Insufficient documentation

## 2014-11-01 DIAGNOSIS — T7421XA Adult sexual abuse, confirmed, initial encounter: Secondary | ICD-10-CM | POA: Diagnosis not present

## 2014-11-01 DIAGNOSIS — Z3202 Encounter for pregnancy test, result negative: Secondary | ICD-10-CM | POA: Insufficient documentation

## 2014-11-01 LAB — URINE MICROSCOPIC-ADD ON

## 2014-11-01 LAB — URINALYSIS, ROUTINE W REFLEX MICROSCOPIC
Glucose, UA: NEGATIVE mg/dL
KETONES UR: 15 mg/dL — AB
Leukocytes, UA: NEGATIVE
NITRITE: NEGATIVE
Protein, ur: NEGATIVE mg/dL
SPECIFIC GRAVITY, URINE: 1.034 — AB (ref 1.005–1.030)
UROBILINOGEN UA: 0.2 mg/dL (ref 0.0–1.0)
pH: 5.5 (ref 5.0–8.0)

## 2014-11-01 LAB — POC URINE PREG, ED: PREG TEST UR: NEGATIVE

## 2014-11-01 MED ORDER — ULIPRISTAL ACETATE 30 MG PO TABS
30.0000 mg | ORAL_TABLET | Freq: Once | ORAL | Status: AC
  Administered 2014-11-01: 30 mg via ORAL
  Filled 2014-11-01: qty 1

## 2014-11-01 MED ORDER — METRONIDAZOLE 500 MG PO TABS
2000.0000 mg | ORAL_TABLET | Freq: Once | ORAL | Status: DC
  Filled 2014-11-01: qty 4

## 2014-11-01 MED ORDER — METRONIDAZOLE 500 MG PO TABS
2000.0000 mg | ORAL_TABLET | Freq: Once | ORAL | Status: AC
  Administered 2014-11-01: 2000 mg via ORAL

## 2014-11-01 MED ORDER — CIPROFLOXACIN HCL 500 MG PO TABS
500.0000 mg | ORAL_TABLET | Freq: Once | ORAL | Status: DC
  Filled 2014-11-01: qty 1

## 2014-11-01 MED ORDER — CEFIXIME 400 MG PO TABS: 400.0000 mg | ORAL_TABLET | Freq: Once | ORAL | Status: DC

## 2014-11-01 MED ORDER — CEFIXIME 400 MG PO TABS
400.0000 mg | ORAL_TABLET | Freq: Once | ORAL | Status: AC
  Administered 2014-11-01: 400 mg via ORAL
  Filled 2014-11-01: qty 1

## 2014-11-01 MED ORDER — PROMETHAZINE HCL 25 MG PO TABS
25.0000 mg | ORAL_TABLET | Freq: Four times a day (QID) | ORAL | Status: DC | PRN
  Filled 2014-11-01: qty 1

## 2014-11-01 MED ORDER — ULIPRISTAL ACETATE 30 MG PO TABS: 30.0000 mg | ORAL_TABLET | Freq: Once | ORAL | Status: DC

## 2014-11-01 MED ORDER — AZITHROMYCIN 1 G PO PACK
1.0000 g | PACK | Freq: Once | ORAL | Status: AC
  Administered 2014-11-01: 1 g via ORAL

## 2014-11-01 MED ORDER — AZITHROMYCIN 1 G PO PACK: 1.0000 g | PACK | Freq: Once | ORAL | Status: DC
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
## 2014-11-01 NOTE — ED Notes (Signed)
Pt stable, ambulatory, states understanding of discharge instructions 
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
## 2014-11-02 LAB — HIV ANTIBODY (ROUTINE TESTING W REFLEX): HIV SCREEN 4TH GENERATION: NONREACTIVE

## 2014-11-02 LAB — RPR: RPR: NONREACTIVE

## 2015-01-15 ENCOUNTER — Encounter (HOSPITAL_COMMUNITY): Payer: Self-pay | Admitting: Emergency Medicine

## 2015-01-15 ENCOUNTER — Emergency Department (HOSPITAL_COMMUNITY): Payer: Medicaid Other

## 2015-01-15 ENCOUNTER — Emergency Department (HOSPITAL_COMMUNITY)
Admission: EM | Admit: 2015-01-15 | Discharge: 2015-01-15 | Disposition: A | Discharge status: Home / Self Care | Payer: Medicaid Other | Attending: Emergency Medicine | Admitting: Emergency Medicine

## 2015-01-15 DIAGNOSIS — K429 Umbilical hernia without obstruction or gangrene: Secondary | ICD-10-CM | POA: Insufficient documentation

## 2015-01-15 DIAGNOSIS — Z720 Tobacco use: Secondary | ICD-10-CM | POA: Insufficient documentation

## 2015-01-15 DIAGNOSIS — R1033 Periumbilical pain: Secondary | ICD-10-CM | POA: Diagnosis present

## 2015-01-15 DIAGNOSIS — Z8751 Personal history of pre-term labor: Secondary | ICD-10-CM | POA: Diagnosis not present

## 2015-01-15 LAB — CBC WITH DIFFERENTIAL/PLATELET
BASOS ABS: 0 10*3/uL (ref 0.0–0.1)
Basophils Relative: 0 % (ref 0–1)
EOS ABS: 0.6 10*3/uL (ref 0.0–0.7)
EOS PCT: 5 % (ref 0–5)
HEMATOCRIT: 37.5 % (ref 36.0–46.0)
HEMOGLOBIN: 12.5 g/dL (ref 12.0–15.0)
LYMPHS ABS: 3.3 10*3/uL (ref 0.7–4.0)
Lymphocytes Relative: 31 % (ref 12–46)
MCH: 26.9 pg (ref 26.0–34.0)
MCHC: 33.3 g/dL (ref 30.0–36.0)
MCV: 80.6 fL (ref 78.0–100.0)
Monocytes Absolute: 0.7 10*3/uL (ref 0.1–1.0)
Monocytes Relative: 7 % (ref 3–12)
Neutro Abs: 5.8 10*3/uL (ref 1.7–7.7)
Neutrophils Relative %: 57 % (ref 43–77)
PLATELETS: 296 10*3/uL (ref 150–400)
RBC: 4.65 MIL/uL (ref 3.87–5.11)
RDW: 13.8 % (ref 11.5–15.5)
WBC: 10.4 10*3/uL (ref 4.0–10.5)

## 2015-01-15 LAB — BASIC METABOLIC PANEL
Anion gap: 8 (ref 5–15)
BUN: 8 mg/dL (ref 6–20)
CALCIUM: 9.2 mg/dL (ref 8.9–10.3)
CO2: 25 mmol/L (ref 22–32)
Chloride: 103 mmol/L (ref 101–111)
Creatinine, Ser: 1.02 mg/dL — ABNORMAL HIGH (ref 0.44–1.00)
GFR calc non Af Amer: >60 mL/min (ref >60)
Glucose, Bld: 93 mg/dL (ref 65–99)
Potassium: 4.3 mmol/L (ref 3.5–5.1)
Sodium: 136 mmol/L (ref 135–145)

## 2015-01-15 LAB — POC URINE PREG, ED: PREG TEST UR: NEGATIVE

## 2015-01-15 MED ORDER — IOHEXOL 300 MG/ML  SOLN
25.0000 mL | Freq: Once | INTRAMUSCULAR | Status: AC | PRN
  Administered 2015-01-15: 25 mL via ORAL

## 2015-01-15 MED ORDER — ACETAMINOPHEN 325 MG PO TABS
650.0000 mg | ORAL_TABLET | Freq: Once | ORAL | Status: AC
Start: 2015-01-15 — End: 2015-01-15
  Administered 2015-01-15: 650 mg via ORAL
  Filled 2015-01-15: qty 2

## 2015-01-15 MED ORDER — MORPHINE SULFATE 4 MG/ML IJ SOLN
4.0000 mg | Freq: Once | INTRAMUSCULAR | Status: AC
  Administered 2015-01-15: 4 mg via INTRAVENOUS
  Filled 2015-01-15: qty 1

## 2015-01-15 MED ORDER — IOHEXOL 300 MG/ML  SOLN
75.0000 mL | Freq: Once | INTRAMUSCULAR | Status: AC | PRN
  Administered 2015-01-15: 100 mL via INTRAVENOUS

## 2015-01-15 MED ORDER — HYDROCODONE-ACETAMINOPHEN 5-325 MG PO TABS: 1.0000 | ORAL_TABLET | ORAL | Status: DC | PRN

## 2015-01-15 NOTE — Discharge Instructions (Signed)

## 2015-01-15 NOTE — ED Notes (Signed)
Pt. Stated, I think I have a hernia in my belly button for 3-4 days.  Its painful

## 2015-01-15 NOTE — ED Provider Notes (Signed)
CSN: [id number]     Arrival date & time 01/15/15  0945 History   First MD Initiated Contact with Patient 01/15/15 1106     Chief Complaint  Patient presents with  . Hernia     (Consider location/radiation/quality/duration/timing/severity/associated sxs/prior Treatment) HPI Comments: Presents with complaint of severe progressive umbilical pain and swelling for the past 2 days. No history of similar symptoms. No nausea, vomiting or fever. She has a had 2 cesarean sections in the past without known hernia.  The history is provided by the patient. No language interpreter was used.    Past Medical History  Diagnosis Date  . Placenta previa   . Preterm labor    Past Surgical History  Procedure Laterality Date  . Cesarean section     Family History  Problem Relation Age of Onset  . Diabetes Paternal Grandmother   . Anesthesia problems Neg Hx   . Hypotension Neg Hx   . Malignant hyperthermia Neg Hx   . Pseudochol deficiency Neg Hx    History  Substance Use Topics  . Smoking status: Current Some Day Smoker -- 0.50 packs/day    Types: Cigarettes  . Smokeless tobacco: Never Used  . Alcohol Use: No   OB History    Gravida Para Term Preterm AB TAB SAB Ectopic Multiple Living   2 1  1      1      Review of Systems  Constitutional: Negative for fever and chills.  Respiratory: Negative.  Negative for shortness of breath.   Cardiovascular: Negative.  Negative for chest pain.  Gastrointestinal: Positive for abdominal pain. Negative for nausea, vomiting and blood in stool.  Genitourinary: Negative.  Negative for dysuria and vaginal discharge.  Musculoskeletal: Negative.   Skin: Negative.   Neurological: Negative.       Allergies  Review of patient's allergies indicates no known allergies.  Home Medications   Prior to Admission medications   Medication Sig Start Date End Date Taking? Authorizing Provider  OVER THE COUNTER MEDICATION Take 1 tablet by mouth daily as needed  (OTC allergy med).   Yes Historical Provider, MD   BP 116/97 mmHg  Pulse 77  Temp(Src) 99.1 F (37.3 C)  Resp 20  SpO2 100%  LMP 01/12/2015  Breastfeeding? Yes Physical Exam  Constitutional: She is oriented to person, place, and time. She appears well-developed and well-nourished.  Well appearing.  HENT:  Head: Normocephalic.  Neck: Normal range of motion. Neck supple.  Cardiovascular: Normal rate and regular rhythm.   Pulmonary/Chest: Effort normal and breath sounds normal.  Abdominal: Soft. Bowel sounds are normal. There is tenderness. There is no rebound and no guarding.  Significantly tender umbilicus without protruding mass. No redness or drainage to suggest infection.  Musculoskeletal: Normal range of motion.  Neurological: She is alert and oriented to person, place, and time.  Skin: Skin is warm and dry. No rash noted.  Psychiatric: She has a normal mood and affect.    ED Course  Procedures (including critical care time) Labs Review Labs Reviewed  CBC WITH DIFFERENTIAL/PLATELET  BASIC METABOLIC PANEL  POC URINE PREG, ED   Results for orders placed or performed during the hospital encounter of 01/15/15  CBC with Differential  Result Value Ref Range   WBC 10.4 4.0 - 10.5 K/uL   RBC 4.65 3.87 - 5.11 MIL/uL   Hemoglobin 12.5 12.0 - 15.0 g/dL   HCT 37.5 36.0 - 46.0 %   MCV 80.6 78.0 - 100.0 fL  MCH 26.9 26.0 - 34.0 pg   MCHC 33.3 30.0 - 36.0 g/dL   RDW 13.8 11.5 - 15.5 %   Platelets 296 150 - 400 K/uL   Neutrophils Relative % 57 43 - 77 %   Neutro Abs 5.8 1.7 - 7.7 K/uL   Lymphocytes Relative 31 12 - 46 %   Lymphs Abs 3.3 0.7 - 4.0 K/uL   Monocytes Relative 7 3 - 12 %   Monocytes Absolute 0.7 0.1 - 1.0 K/uL   Eosinophils Relative 5 0 - 5 %   Eosinophils Absolute 0.6 0.0 - 0.7 K/uL   Basophils Relative 0 0 - 1 %   Basophils Absolute 0.0 0.0 - 0.1 K/uL  Basic metabolic panel  Result Value Ref Range   Sodium 136 135 - 145 mmol/L   Potassium 4.3 3.5 - 5.1  mmol/L   Chloride 103 101 - 111 mmol/L   CO2 25 22 - 32 mmol/L   Glucose, Bld 93 65 - 99 mg/dL   BUN 8 6 - 20 mg/dL   Creatinine, Ser 1.02 (H) 0.44 - 1.00 mg/dL   Calcium 9.2 8.9 - 10.3 mg/dL   GFR calc non Af Amer >60 >60 mL/min   GFR calc Af Amer >60 >60 mL/min   Anion gap 8 5 - 15  POC urine preg, ED  Result Value Ref Range   Preg Test, Ur NEGATIVE NEGATIVE   Ct Abdomen W Contrast  01/15/2015   CLINICAL DATA:  29 year old female with periumbilical pain for the past 3 days.  EXAM: CT ABDOMEN WITH CONTRAST  TECHNIQUE: Multidetector CT imaging of the abdomen was performed using the standard protocol following bolus administration of intravenous contrast.  CONTRAST:  189mL OMNIPAQUE IOHEXOL 300 MG/ML  SOLN  COMPARISON:  No priors.  FINDINGS: Lower chest:  Unremarkable.  Hepatobiliary: No cystic or solid hepatic lesions. No intra or extrahepatic biliary ductal dilatation. Gallbladder is normal in appearance.  Pancreas: No pancreatic mass. No pancreatic ductal dilatation. No pancreatic or peripancreatic fluid or inflammatory changes.  Spleen: Unremarkable.  Adrenals/Urinary Tract: Normal appearance of the kidneys and bilateral adrenal glands. No hydroureteronephrosis in the visualized abdomen.  Stomach/Bowel: Normal appearance of the stomach. No pathologic dilatation of the visualized portions of small bowel or colon.  Vascular/Lymphatic: No aneurysm or dissection identified in the visualized abdominal vasculature. No lymphadenopathy noted in the visualized portions of the abdomen.  Other: Small umbilical hernia containing omental fat. No significant volume of ascites and no pneumoperitoneum noted in the visualized portions of the peritoneal cavity.  Musculoskeletal: There are no aggressive appearing lytic or blastic lesions noted in the visualized portions of the skeleton.  IMPRESSION: 1. Small umbilical hernia containing only fat. No associated bowel incarceration or obstruction at this time. 2. No  other potential acute findings in the abdomen to account for the patient's symptoms. Notably, the appendix was not visualized on this abdominal CT scan, which did not extend low enough into the pelvis to visualize the appendix or cecum.   Electronically Signed   By: Vinnie Langton M.D.   On: 01/15/2015 14:38    Imaging Review No results found.   EKG Interpretation None      MDM   Final diagnoses:  None    1. Umbilical hernia  Pain managed in ED. VSS, low grade temperature.   CT showing small umbilical hernia containing fat only, no incarcerated intestine. Will refer to surgery for outpatient consultation for further treatment.     Charlann Lange,  PA-C 01/15/15 [place], MD 01/16/15 1336

## 2015-07-08 ENCOUNTER — Emergency Department (HOSPITAL_COMMUNITY)
Admission: EM | Admit: 2015-07-08 | Discharge: 2015-07-08 | Disposition: A | Discharge status: Home / Self Care | Payer: Medicaid Other | Source: Home / Self Care | Attending: Family Medicine | Admitting: Family Medicine

## 2015-07-08 ENCOUNTER — Encounter (HOSPITAL_COMMUNITY): Payer: Self-pay | Admitting: Emergency Medicine

## 2015-07-08 DIAGNOSIS — I889 Nonspecific lymphadenitis, unspecified: Secondary | ICD-10-CM | POA: Diagnosis not present

## 2015-07-08 MED ORDER — CLINDAMYCIN HCL 300 MG PO CAPS: 300.0000 mg | ORAL_CAPSULE | Freq: Four times a day (QID) | ORAL | Status: DC

## 2015-07-08 NOTE — ED Provider Notes (Signed)
CSN: [id number]     Arrival date & time 07/08/15  1452 History   First MD Initiated Contact with Patient 07/08/15 1643     Chief Complaint  Patient presents with  . Facial Swelling    left  . Otalgia    left   (Consider location/radiation/quality/duration/timing/severity/associated sxs/prior Treatment) HPI Patient presents with recent URI symptoms and now has a swollen area left anterior neck. Symptoms and present for several days now she states the lump is sore to touch. She is treated as symptomatically at home including heat but is not getting any better. No fever at home. Past Medical History  Diagnosis Date  . Placenta previa   . Preterm labor    Past Surgical History  Procedure Laterality Date  . Cesarean section     Family History  Problem Relation Age of Onset  . Diabetes Paternal Grandmother   . Anesthesia problems Neg Hx   . Hypotension Neg Hx   . Malignant hyperthermia Neg Hx   . Pseudochol deficiency Neg Hx    Social History  Substance Use Topics  . Smoking status: Current Every Day Smoker -- 0.25 packs/day    Types: Cigarettes  . Smokeless tobacco: Never Used  . Alcohol Use: No   OB History    Gravida Para Term Preterm AB TAB SAB Ectopic Multiple Living   2 1  1      1      Review of Systems ROS +'ve swollen left neck  Denies: HEADACHE, NAUSEA, ABDOMINAL PAIN, CHEST PAIN, CONGESTION, DYSURIA, SHORTNESS OF BREATH  Allergies  Review of patient's allergies indicates no known allergies.  Home Medications   Prior to Admission medications   Medication Sig Start Date End Date Taking? Authorizing Provider  acetaminophen (TYLENOL) 325 MG tablet Take 650 mg by mouth every 6 (six) hours as needed for mild pain.   Yes Historical Provider, MD  ibuprofen (ADVIL,MOTRIN) 200 MG tablet Take 800 mg by mouth every 4 (four) hours as needed for moderate pain.   Yes Historical Provider, MD  clindamycin (CLEOCIN) 300 MG capsule Take 1 capsule (300 mg total) by mouth every  6 (six) hours. 07/08/15   Konrad Felix, PA  HYDROcodone-acetaminophen (NORCO/VICODIN) 5-325 MG per tablet Take 1-2 tablets by mouth every 4 (four) hours as needed. 01/15/15   Charlann Lange, PA-C  OVER THE COUNTER MEDICATION Take 1 tablet by mouth daily as needed (OTC allergy med).    Historical Provider, MD   Meds Ordered and Administered this Visit  Medications - No data to display  BP 139/78 mmHg  Pulse 84  Temp(Src) 97.9 F (36.6 C) (Oral)  Resp 16  SpO2 100%  LMP 05/24/2015 (Exact Date) No data found.   Physical Exam  Constitutional: She is oriented to person, place, and time. She appears well-developed and well-nourished.  HENT:  Head: Normocephalic and atraumatic.  Right Ear: External ear normal.  Left Ear: External ear normal.  Mouth/Throat: Oropharynx is clear and moist.  Eyes: Conjunctivae are normal.  Neck:  Left cervical chain swollen lymph node.  Cardiovascular: Normal rate.   Pulmonary/Chest: Effort normal.  Abdominal: Soft.  Musculoskeletal: Normal range of motion.  Lymphadenopathy:    She has cervical adenopathy.  Neurological: She is alert and oriented to person, place, and time.  Skin: Skin is warm and dry.  Psychiatric: She has a normal mood and affect. Her behavior is normal.  Nursing note and vitals reviewed.   ED Course  Procedures (including critical care time)  Labs Review Labs Reviewed - No data to display  Imaging Review No results found.   Visual Acuity Review  Right Eye Distance:   Left Eye Distance:   Bilateral Distance:    Right Eye Near:   Left Eye Near:    Bilateral Near:         MDM   1. Lymphadenitis    Patient does not have any animals at home especially cats no history Scratch Scratch. Treat symptomatically with Keflex 500 mg 3 times a day for 7 days. Warm compresses died in activity as tolerated.    Konrad Felix, PA 07/08/15 2025

## 2015-07-08 NOTE — ED Notes (Signed)
Pt has had swelling on the left side of her face near her ear for about two weeks.  She started experiencing pain a few days ago.  She has also had nasal congestion with the swelling.  She denies any fever.

## 2015-07-08 NOTE — Discharge Instructions (Signed)

## 2015-08-02 ENCOUNTER — Emergency Department (HOSPITAL_COMMUNITY): Payer: Medicaid Other

## 2015-08-02 ENCOUNTER — Encounter (HOSPITAL_COMMUNITY): Payer: Self-pay | Admitting: *Deleted

## 2015-08-02 ENCOUNTER — Emergency Department (HOSPITAL_COMMUNITY)
Admission: EM | Admit: 2015-08-02 | Discharge: 2015-08-02 | Disposition: A | Discharge status: Home / Self Care | Payer: Medicaid Other | Attending: Emergency Medicine | Admitting: Emergency Medicine

## 2015-08-02 DIAGNOSIS — R51 Headache: Secondary | ICD-10-CM | POA: Insufficient documentation

## 2015-08-02 DIAGNOSIS — F419 Anxiety disorder, unspecified: Secondary | ICD-10-CM | POA: Diagnosis not present

## 2015-08-02 DIAGNOSIS — F17210 Nicotine dependence, cigarettes, uncomplicated: Secondary | ICD-10-CM | POA: Diagnosis not present

## 2015-08-02 DIAGNOSIS — R200 Anesthesia of skin: Secondary | ICD-10-CM | POA: Diagnosis not present

## 2015-08-02 DIAGNOSIS — R079 Chest pain, unspecified: Secondary | ICD-10-CM | POA: Insufficient documentation

## 2015-08-02 LAB — BASIC METABOLIC PANEL
Anion gap: 9 (ref 5–15)
BUN: 7 mg/dL (ref 6–20)
CALCIUM: 9.2 mg/dL (ref 8.9–10.3)
CHLORIDE: 106 mmol/L (ref 101–111)
CO2: 23 mmol/L (ref 22–32)
CREATININE: 0.84 mg/dL (ref 0.44–1.00)
GFR calc Af Amer: >60 mL/min (ref >60)
GFR calc non Af Amer: >60 mL/min (ref >60)
GLUCOSE: 98 mg/dL (ref 65–99)
Potassium: 3.7 mmol/L (ref 3.5–5.1)
Sodium: 138 mmol/L (ref 135–145)

## 2015-08-02 LAB — CBC
HCT: 36.8 % (ref 36.0–46.0)
Hemoglobin: 12.2 g/dL (ref 12.0–15.0)
MCH: 25.1 pg — ABNORMAL LOW (ref 26.0–34.0)
MCHC: 33.2 g/dL (ref 30.0–36.0)
MCV: 75.7 fL — AB (ref 78.0–100.0)
PLATELETS: 356 10*3/uL (ref 150–400)
RBC: 4.86 MIL/uL (ref 3.87–5.11)
RDW: 15 % (ref 11.5–15.5)
WBC: 16.3 10*3/uL — ABNORMAL HIGH (ref 4.0–10.5)

## 2015-08-02 LAB — TROPONIN I

## 2015-08-02 NOTE — ED Notes (Signed)
The pt decided to leave  She is tired of waiting

## 2015-08-02 NOTE — ED Notes (Signed)
Pt left without seen

## 2015-08-02 NOTE — ED Notes (Signed)
The pt is c/o chest pain and a headache for one hour  Very anxious.  She is c/o some numbness in her lt arm also. lmp last month

## 2016-08-09 IMAGING — DX DG CHEST 2V
2 series · 2 of 2 positions shown · non-contrast
Comparison: Chest radiograph performed 05/06/2011

CLINICAL DATA: Acute onset of generalized chest pain and shortness
of breath. Initial encounter.

EXAM:
CHEST  2 VIEW

[chest pa]
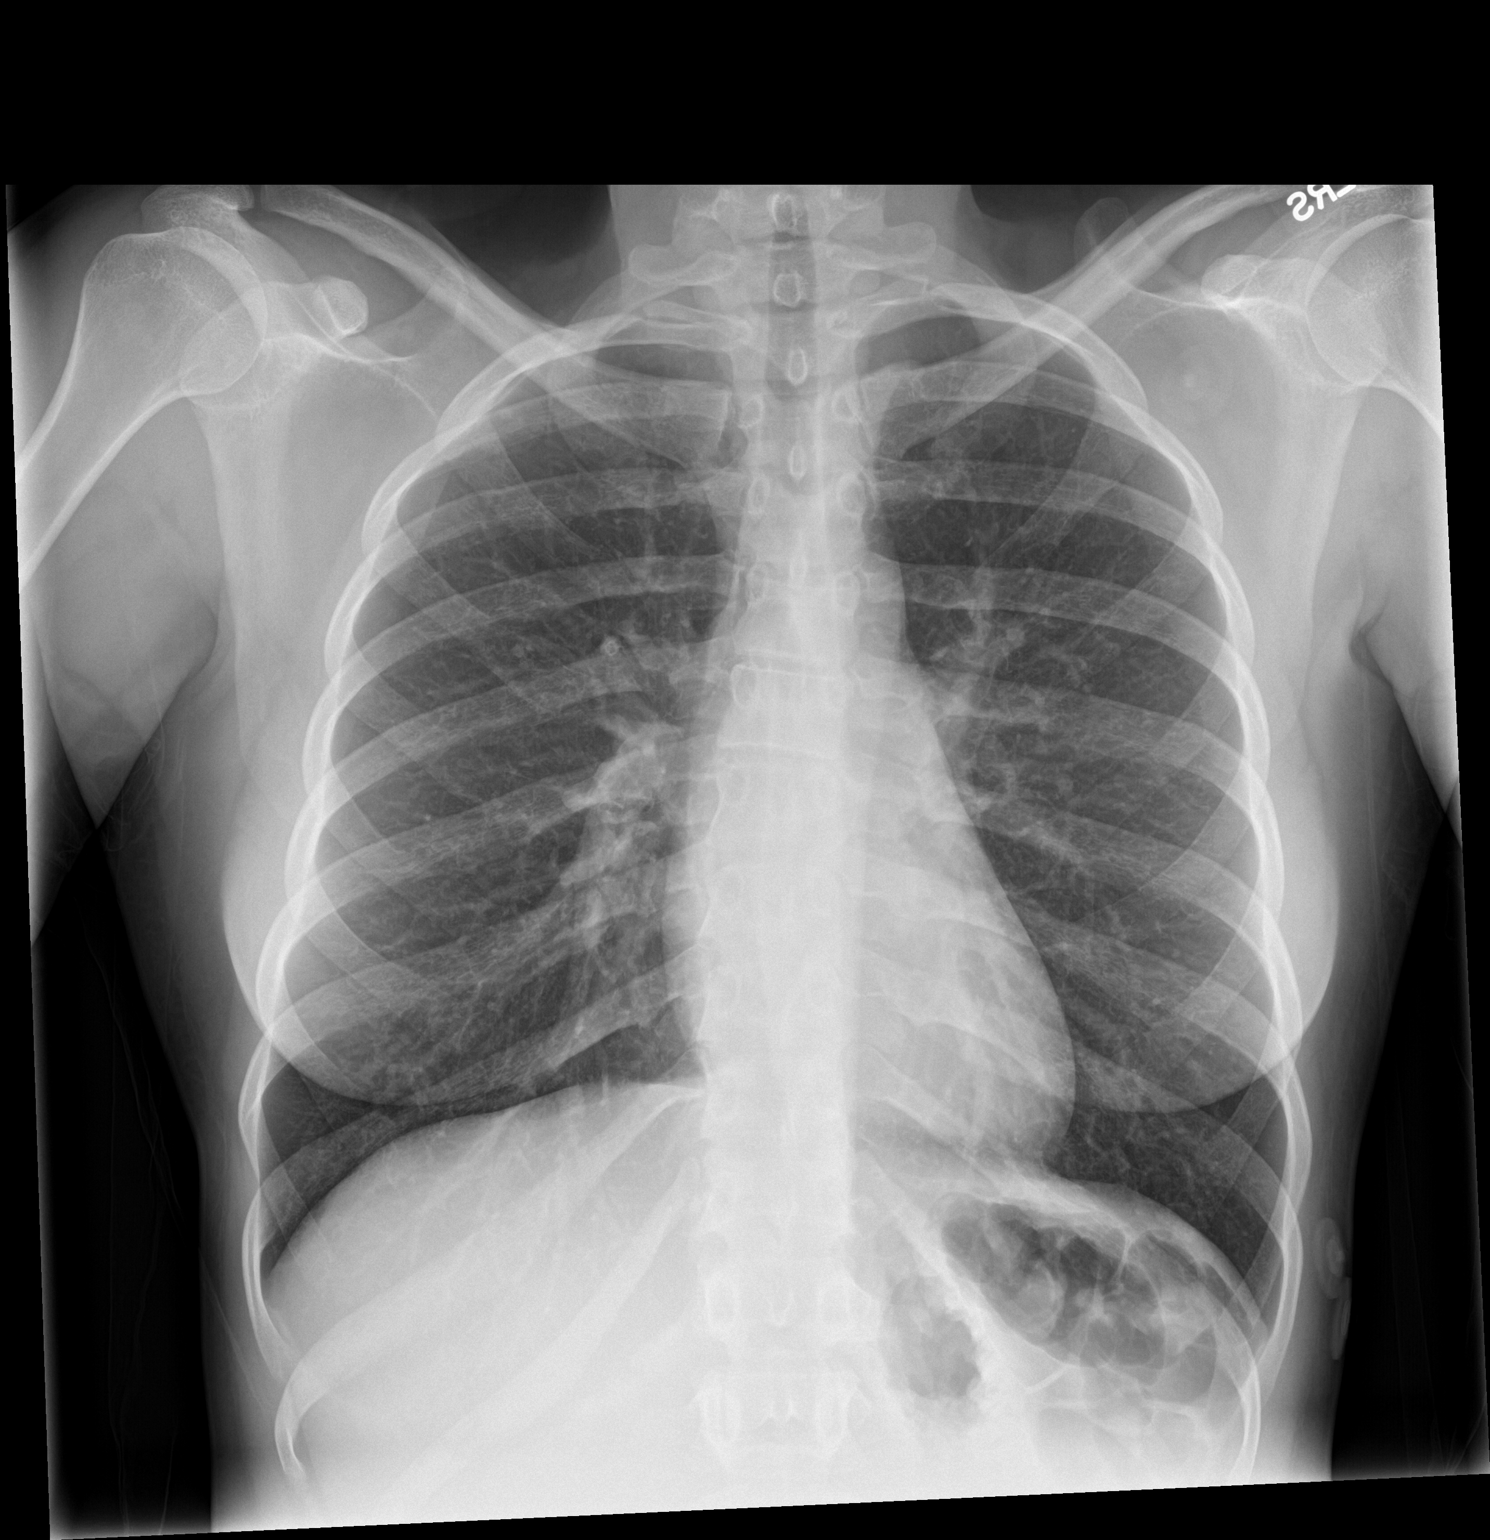

[chest lat]
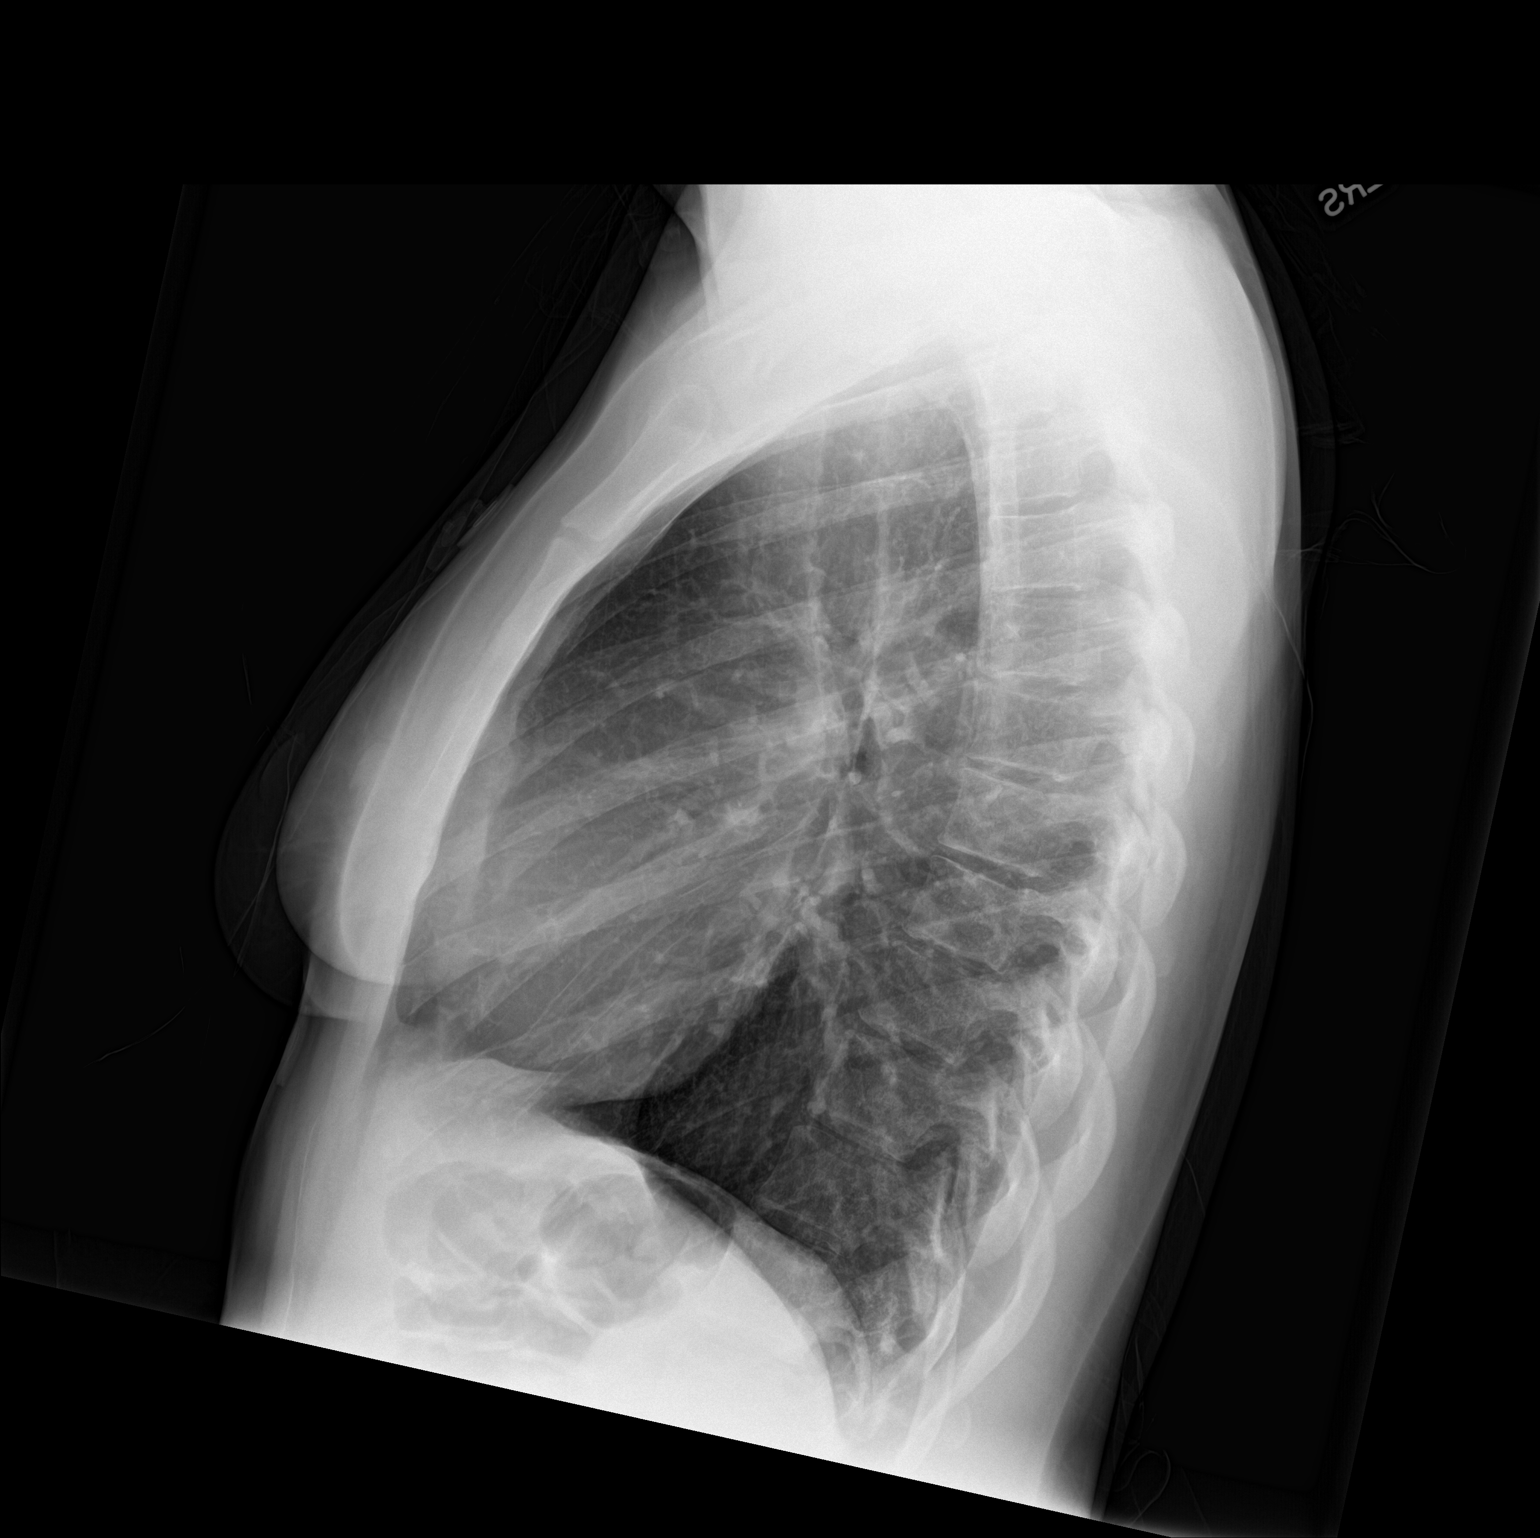

[2 of 2 positions shown; findings below may reference images not displayed]

FINDINGS: The lungs are well-aerated and clear. There is no evidence of focal
opacification, pleural effusion or pneumothorax.

The heart is normal in size; the mediastinal contour is within
normal limits. No acute osseous abnormalities are seen.
IMPRESSION: No acute cardiopulmonary process seen.

## 2016-09-05 ENCOUNTER — Inpatient Hospital Stay (HOSPITAL_COMMUNITY)
Admission: AD | Admit: 2016-09-05 | Discharge: 2016-09-05 | Disposition: A | Discharge status: Home / Self Care | Payer: Medicaid Other | Source: Ambulatory Visit | Attending: Obstetrics & Gynecology | Admitting: Obstetrics & Gynecology

## 2016-09-05 ENCOUNTER — Inpatient Hospital Stay (HOSPITAL_COMMUNITY): Payer: Medicaid Other

## 2016-09-05 ENCOUNTER — Encounter (HOSPITAL_COMMUNITY): Payer: Self-pay

## 2016-09-05 DIAGNOSIS — A599 Trichomoniasis, unspecified: Secondary | ICD-10-CM | POA: Insufficient documentation

## 2016-09-05 DIAGNOSIS — Z8619 Personal history of other infectious and parasitic diseases: Secondary | ICD-10-CM

## 2016-09-05 DIAGNOSIS — Z3A11 11 weeks gestation of pregnancy: Secondary | ICD-10-CM | POA: Insufficient documentation

## 2016-09-05 DIAGNOSIS — O98811 Other maternal infectious and parasitic diseases complicating pregnancy, first trimester: Secondary | ICD-10-CM | POA: Insufficient documentation

## 2016-09-05 DIAGNOSIS — O9989 Other specified diseases and conditions complicating pregnancy, childbirth and the puerperium: Secondary | ICD-10-CM

## 2016-09-05 DIAGNOSIS — O26892 Other specified pregnancy related conditions, second trimester: Secondary | ICD-10-CM | POA: Diagnosis not present

## 2016-09-05 DIAGNOSIS — O3680X0 Pregnancy with inconclusive fetal viability, not applicable or unspecified: Secondary | ICD-10-CM

## 2016-09-05 DIAGNOSIS — R109 Unspecified abdominal pain: Secondary | ICD-10-CM | POA: Diagnosis present

## 2016-09-05 DIAGNOSIS — O99331 Smoking (tobacco) complicating pregnancy, first trimester: Secondary | ICD-10-CM | POA: Diagnosis not present

## 2016-09-05 DIAGNOSIS — O26899 Other specified pregnancy related conditions, unspecified trimester: Secondary | ICD-10-CM

## 2016-09-05 DIAGNOSIS — M549 Dorsalgia, unspecified: Secondary | ICD-10-CM | POA: Diagnosis present

## 2016-09-05 HISTORY — DX: Personal history of other infectious and parasitic diseases: Z86.19

## 2016-09-05 LAB — CBC
HCT: 34 % — ABNORMAL LOW (ref 36.0–46.0)
HEMOGLOBIN: 11.3 g/dL — AB (ref 12.0–15.0)
MCH: 25.2 pg — AB (ref 26.0–34.0)
MCHC: 33.2 g/dL (ref 30.0–36.0)
MCV: 75.7 fL — ABNORMAL LOW (ref 78.0–100.0)
PLATELETS: 339 10*3/uL (ref 150–400)
RBC: 4.49 MIL/uL (ref 3.87–5.11)
RDW: 14.8 % (ref 11.5–15.5)
WBC: 11.2 10*3/uL — ABNORMAL HIGH (ref 4.0–10.5)

## 2016-09-05 LAB — URINALYSIS, ROUTINE W REFLEX MICROSCOPIC
GLUCOSE, UA: NEGATIVE mg/dL
Ketones, ur: NEGATIVE mg/dL
Nitrite: NEGATIVE
PROTEIN: NEGATIVE mg/dL
Specific Gravity, Urine: 1.02 (ref 1.005–1.030)
pH: 6 (ref 5.0–8.0)

## 2016-09-05 LAB — POCT PREGNANCY, URINE: PREG TEST UR: POSITIVE — AB

## 2016-09-05 LAB — URINALYSIS, MICROSCOPIC (REFLEX)

## 2016-09-05 LAB — HCG, QUANTITATIVE, PREGNANCY: HCG, BETA CHAIN, QUANT, S: 1023 m[IU]/mL — AB (ref <5)

## 2016-09-05 LAB — WET PREP, GENITAL
Sperm: NONE SEEN
Yeast Wet Prep HPF POC: NONE SEEN

## 2016-09-05 MED ORDER — METRONIDAZOLE 500 MG PO TABS
2000.0000 mg | ORAL_TABLET | Freq: Once | ORAL | Status: AC
  Administered 2016-09-05: 2000 mg via ORAL
  Filled 2016-09-05: qty 4

## 2016-09-05 NOTE — MAU Note (Signed)
Pt c/o lower back pain and abdominal pain for the last couple of days. Pt states the last time she had a normal period was in January. Pt state she had some spotting two days ago but none today.

## 2016-09-05 NOTE — MAU Provider Note (Signed)
History     CSN: [id number]  Arrival date and time: 09/05/16 7262   First Provider Initiated Contact with Patient 09/05/16 [phone]      Chief Complaint  Patient presents with  . Abdominal Pain  . Back Pain   HPI Audrey Dougherty is a 31 y.o. [id number] who presents with abdominal pain. LMP was in January; has not taken pregnancy test. Pain started 3 days ago. Reports intermittent sharp pains in her lower abdomen that radiate to her low back. Rates pain 5/10. Has not treated. Nothing makes better or worse. Noted light red spotting on toilet paper 3 days ago; no bleeding since. Denies n/v/d, constipation, dysuria, or vaginal discharge. Last BM was yesterday.   OB History    Gravida Para Term Preterm AB Living   3 2 1 1   2    SAB TAB Ectopic Multiple Live Births           2      Past Medical History:  Diagnosis Date  . Placenta previa   . Preterm labor     Past Surgical History:  Procedure Laterality Date  . CESAREAN SECTION      Family History  Problem Relation Age of Onset  . Diabetes Paternal Grandmother   . Anesthesia problems Neg Hx   . Hypotension Neg Hx   . Malignant hyperthermia Neg Hx   . Pseudochol deficiency Neg Hx     Social History  Substance Use Topics  . Smoking status: Current Every Day Smoker    Packs/day: 0.25    Types: Cigarettes  . Smokeless tobacco: Never Used  . Alcohol use No    Allergies: No Known Allergies  Prescriptions Prior to Admission  Medication Sig Dispense Refill Last Dose  . acetaminophen (TYLENOL) 325 MG tablet Take 650 mg by mouth every 6 (six) hours as needed for mild pain.   07/08/2015 at Unknown time  . clindamycin (CLEOCIN) 300 MG capsule Take 1 capsule (300 mg total) by mouth every 6 (six) hours. 30 capsule 0   . HYDROcodone-acetaminophen (NORCO/VICODIN) 5-325 MG per tablet Take 1-2 tablets by mouth every 4 (four) hours as needed. 12 tablet 0   . ibuprofen (ADVIL,MOTRIN) 200 MG tablet Take 800 mg by mouth every 4 (four) hours as  needed for moderate pain.   07/08/2015 at Unknown time  . OVER THE COUNTER MEDICATION Take 1 tablet by mouth daily as needed (OTC allergy med).   Unknown at Unknown time    Review of Systems  Constitutional: Negative.   Gastrointestinal: Positive for abdominal pain. Negative for constipation, diarrhea, nausea and vomiting.  Genitourinary: Positive for vaginal bleeding. Negative for dysuria and vaginal discharge.  Musculoskeletal: Positive for back pain.   Physical Exam   Blood pressure 127/82, pulse 99, temperature 98.4 F (36.9 C), temperature source Oral, resp. rate 18, height 5\' 5"  (1.651 m), weight 154 lb 4 oz (70 kg), last menstrual period 06/23/2016, SpO2 99 %, currently breastfeeding.  Physical Exam  Nursing note and vitals reviewed. Constitutional: She is oriented to person, place, and time. She appears well-developed and well-nourished. No distress.  HENT:  Head: Normocephalic and atraumatic.  Eyes: Conjunctivae are normal. Right eye exhibits no discharge. Left eye exhibits no discharge. No scleral icterus.  Neck: Normal range of motion.  Cardiovascular: Normal rate, regular rhythm and normal heart sounds.   No murmur heard. Respiratory: Effort normal and breath sounds normal. No respiratory distress. She has no wheezes.  GI: Soft. Bowel sounds are normal.  She exhibits no distension. There is no tenderness. There is no rebound and no guarding.  Genitourinary: Uterus normal. Cervix exhibits friability. Cervix exhibits no motion tenderness. Right adnexum displays no mass and no tenderness. Left adnexum displays no mass and no tenderness. No bleeding in the vagina. Vaginal discharge (moderate amount of thin white discharge) found.  Genitourinary Comments: Cervix closed  Neurological: She is alert and oriented to person, place, and time.  Skin: Skin is warm and dry. She is not diaphoretic.  Psychiatric: She has a normal mood and affect. Her behavior is normal. Judgment and thought  content normal.    MAU Course  Procedures Results for orders placed or performed during the hospital encounter of 09/05/16 (from the past 24 hour(s))  Urinalysis, Routine w reflex microscopic     Status: Abnormal   Collection Time: 09/05/16  7:40 AM  Result Value Ref Range   Color, Urine YELLOW YELLOW   APPearance CLOUDY (A) CLEAR   Specific Gravity, Urine 1.020 1.005 - 1.030   pH 6.0 5.0 - 8.0   Glucose, UA NEGATIVE NEGATIVE mg/dL   Hgb urine dipstick TRACE (A) NEGATIVE   Bilirubin Urine SMALL (A) NEGATIVE   Ketones, ur NEGATIVE NEGATIVE mg/dL   Protein, ur NEGATIVE NEGATIVE mg/dL   Nitrite NEGATIVE NEGATIVE   Leukocytes, UA MODERATE (A) NEGATIVE  Urinalysis, Microscopic (reflex)     Status: Abnormal   Collection Time: 09/05/16  7:40 AM  Result Value Ref Range   RBC / HPF 6-30 0 - 5 RBC/hpf   WBC, UA 6-30 0 - 5 WBC/hpf   Bacteria, UA MANY (A) NONE SEEN   Squamous Epithelial / LPF 6-30 (A) NONE SEEN   Trichomonas, UA PRESENT   Pregnancy, urine POC     Status: Abnormal   Collection Time: 09/05/16  8:09 AM  Result Value Ref Range   Preg Test, Ur POSITIVE (A) NEGATIVE  CBC     Status: Abnormal   Collection Time: 09/05/16  8:42 AM  Result Value Ref Range   WBC 11.2 (H) 4.0 - 10.5 K/uL   RBC 4.49 3.87 - 5.11 MIL/uL   Hemoglobin 11.3 (L) 12.0 - 15.0 g/dL   HCT 34.0 (L) 36.0 - 46.0 %   MCV 75.7 (L) 78.0 - 100.0 fL   MCH 25.2 (L) 26.0 - 34.0 pg   MCHC 33.2 30.0 - 36.0 g/dL   RDW 14.8 11.5 - 15.5 %   Platelets 339 150 - 400 K/uL  hCG, quantitative, pregnancy     Status: Abnormal   Collection Time: 09/05/16  8:42 AM  Result Value Ref Range   hCG, Beta Chain, Quant, S 1,023 (H) <5 mIU/mL  Wet prep, genital     Status: Abnormal   Collection Time: 09/05/16  8:55 AM  Result Value Ref Range   Yeast Wet Prep HPF POC NONE SEEN NONE SEEN   Trich, Wet Prep PRESENT (A) NONE SEEN   Clue Cells Wet Prep HPF POC PRESENT (A) NONE SEEN   WBC, Wet Prep HPF POC FEW (A) NONE SEEN   Sperm  NONE SEEN    US Ob Comp Less 14 Wks  Result Date: 09/05/2016 CLINICAL DATA:  Abdominal pain affecting pregnancy O26.899, R10.9 (ICD-10-CM). Patient is 10 weeks, 4 days pregnant based on her last menstrual period EXAM: OBSTETRIC <14 WK [place] AND TRANSVAGINAL OB US TECHNIQUE: Both transabdominal and transvaginal ultrasound examinations were performed for complete evaluation of the gestation as well as the maternal uterus, adnexal regions, and pelvic  cul-de-sac. Transvaginal technique was performed to assess early pregnancy. COMPARISON:  None. FINDINGS: Intrauterine gestational sac: Tiny cystic structure noted in the left upper uterine segment endometrium, which could reflect a gestational sac. No other evidence of an intrauterine pregnancy. Yolk sac:  Not Visualized. Embryo:  Not Visualized. MSD: 2.5  mm   4 w   6  d Subchorionic hemorrhage:  None visualized. Maternal uterus/adnexae: No uterine masses.  Cervix is closed. Right ovarian corpus luteum.  Ovaries unremarkable. There is a 1 cm calcification with posterior acoustic shadowing along the right adnexa near the right ovary. This could reflect an appendicolith but is nonspecific. IMPRESSION: 1. Possible small intrauterine gestational sac suggesting a 4 week, 6 day pregnancy. This will need correlated with the patient's beta HCG level. No yolk sac or embryo. 2. Right adnexal calcification of unclear etiology. If patient is not pregnant, and if there are symptoms of right pelvic/lower quadrant pain, consider follow-up abdomen pelvis CT with contrast for further assessment. Electronically Signed   By: Lajean Manes M.D.   On: 09/05/2016 09:48   US Ob Transvaginal  Result Date: 09/05/2016 CLINICAL DATA:  Abdominal pain affecting pregnancy O26.899, R10.9 (ICD-10-CM). Patient is 10 weeks, 4 days pregnant based on her last menstrual period EXAM: OBSTETRIC <14 WK [place] AND TRANSVAGINAL OB US TECHNIQUE: Both transabdominal and transvaginal ultrasound examinations were  performed for complete evaluation of the gestation as well as the maternal uterus, adnexal regions, and pelvic cul-de-sac. Transvaginal technique was performed to assess early pregnancy. COMPARISON:  None. FINDINGS: Intrauterine gestational sac: Tiny cystic structure noted in the left upper uterine segment endometrium, which could reflect a gestational sac. No other evidence of an intrauterine pregnancy. Yolk sac:  Not Visualized. Embryo:  Not Visualized. MSD: 2.5  mm   4 w   6  d Subchorionic hemorrhage:  None visualized. Maternal uterus/adnexae: No uterine masses.  Cervix is closed. Right ovarian corpus luteum.  Ovaries unremarkable. There is a 1 cm calcification with posterior acoustic shadowing along the right adnexa near the right ovary. This could reflect an appendicolith but is nonspecific. IMPRESSION: 1. Possible small intrauterine gestational sac suggesting a 4 week, 6 day pregnancy. This will need correlated with the patient's beta HCG level. No yolk sac or embryo. 2. Right adnexal calcification of unclear etiology. If patient is not pregnant, and if there are symptoms of right pelvic/lower quadrant pain, consider follow-up abdomen pelvis CT with contrast for further assessment. Electronically Signed   By: Lajean Manes M.D.   On: 09/05/2016 09:48     MDM +UPT UA, wet prep, GC/chlamydia, CBC, ABO/Rh, quant hCG, HIV, and [place] today to rule out ectopic pregnancy O positive Should be 10+ weeks by LMP -- unable to doppler FHTs -- will continue with ultrasound Wet prep & urine micro -- trich + Flagyl 2 mg PO in MAU Ultrasound shows possible IUGS, no yolk sac, no evidence of ectopic This abdominal pain could represent a normal pregnancy, spontaneous abortion, or even an ectopic pregnancy which can be life-threatening. Cultures were obtained to rule out pelvic infection.   Assessment and Plan  A:  1. Pregnancy of unknown anatomic location   2. Abdominal pain affecting pregnancy   3.  Trichomoniasis    P: Discharge home Expedited partner tx & info sheet given to patient No intercourse x 1 week after both treated Discussed reasons to return to MAU Go to [facility] Monday morning for repeat BHCG  GC/CT pending   Jorje Guild 09/05/2016,  8:24 AM

## 2016-09-05 NOTE — Discharge Instructions (Signed)
Return to care   If you have heavier bleeding that soaks through more that 2 pads per hour for an hour or more  If you bleed so much that you feel like you might pass out or you do pass out  If you have significant abdominal pain that is not improved with Tylenol   If you develop a fever > 100.5      Trichomoniasis Trichomoniasis is an STI (sexually transmitted infection) that can affect both women and men. In women, the outer area of the female genitalia (vulva) and the vagina are affected. In men, the penis is mainly affected, but the prostate and other reproductive organs can also be involved. This condition can be treated with medicine. It often has no symptoms (is asymptomatic), especially in men. What are the causes? This condition is caused by an organism called Trichomonas vaginalis. Trichomoniasis most often spreads from person to person (is contagious) through sexual contact. What increases the risk? The following factors may make you more likely to develop this condition:  Having unprotected sexual intercourse.  Having sexual intercourse with a partner who has trichomoniasis.  Having multiple sexual partners.  Having had previous trichomoniasis infections or other STIs. What are the signs or symptoms? In women, symptoms of trichomoniasis include:  Abnormal vaginal discharge that is clear, white, gray, or yellow-green and foamy and has an unusual "fishy" odor.  Itching and irritation of the vagina and vulva.  Burning or pain during urination or sexual intercourse.  Genital redness and swelling. In men, symptoms of trichomoniasis include:  Penile discharge that may be foamy or contain pus.  Pain in the penis. This may happen only when urinating.  Itching or irritation inside the penis.  Burning after urination or ejaculation. How is this diagnosed? In women, this condition may be found during a routine Pap test or physical exam. It may be found in men during a  routine physical exam. Your health care provider may perform tests to help diagnose this infection, such as:  Urine tests (men and women).  The following in women:  Testing the pH of the vagina.  A vaginal swab test that checks for the Trichomonas vaginalis organism.  Testing vaginal secretions. Your health care provider may test you for other STIs, including HIV (human immunodeficiency virus). How is this treated? This condition is treated with medicine taken by mouth (orally), such as metronidazole or tinidazole to fight the infection. Your sexual partner(s) may also need to be tested and treated.  If you are a woman and you plan to become pregnant or think you may be pregnant, tell your health care provider right away. Some medicines that are used to treat the infection should not be taken during pregnancy. Your health care provider may recommend over-the-counter medicines or creams to help relieve itching or irritation. You may be tested for infection again 3 months after treatment. Follow these instructions at home:  Take and use over-the-counter and prescription medicines, including creams, only as told by your health care provider.  Do not have sexual intercourse until one week after you finish your medicine, or until your health care provider approves. Ask your health care provider when you may resume sexual intercourse.  (Women) Do not douche or wear tampons while you have the infection.  Discuss your infection with your sexual partner(s). Make sure that your partner gets tested and treated, if necessary.  Keep all follow-up visits as told by your health care provider. This is important. How is  this prevented?  Use condoms every time you have sex. Using condoms correctly and consistently can help protect against STIs.  Avoid having multiple sexual partners.  Talk with your sexual partner about any symptoms that either of you may have, as well as any history of STIs.  Get  tested for STIs and STDs (sexually transmitted diseases) before you have sex. Ask your partner to do the same.  Do not have sexual contact if you have symptoms of trichomoniasis or another STI. Contact a health care provider if:  You still have symptoms after you finish your medicine.  You develop pain in your abdomen.  You have pain when you urinate.  You have bleeding after sexual intercourse.  You develop a rash.  You feel nauseous or you vomit.  You plan to become pregnant or think you may be pregnant. Summary  Trichomoniasis is an STI (sexually transmitted infection) that can affect both women and men.  This condition often has no symptoms (is asymptomatic), especially in men.  You should not have sexual intercourse until one week after you finish your medicine, or until your health care provider approves. Ask your health care provider when you may resume sexual intercourse.  Discuss your infection with your sexual partner. Make sure that your partner gets tested and treated, if necessary. This information is not intended to replace advice given to you by your health care provider. Make sure you discuss any questions you have with your health care provider. Document Released: 11/18/2000 Document Revised: 04/17/2016 Document Reviewed: 04/17/2016 Elsevier Interactive Patient Education  2017 [employer].

## 2016-09-06 LAB — HIV ANTIBODY (ROUTINE TESTING W REFLEX): HIV Screen 4th Generation wRfx: NONREACTIVE

## 2016-09-07 ENCOUNTER — Telehealth: Payer: Self-pay | Admitting: General Practice

## 2016-09-07 ENCOUNTER — Ambulatory Visit: Payer: Self-pay | Admitting: General Practice

## 2016-09-07 DIAGNOSIS — O3680X0 Pregnancy with inconclusive fetal viability, not applicable or unspecified: Secondary | ICD-10-CM

## 2016-09-07 LAB — GC/CHLAMYDIA PROBE AMP (~~LOC~~) NOT AT ARMC
CHLAMYDIA, DNA PROBE: NEGATIVE
Neisseria Gonorrhea: NEGATIVE

## 2016-09-07 LAB — HCG, QUANTITATIVE, PREGNANCY: hCG, Beta Chain, Quant, S: 2122 m[IU]/mL — ABNORMAL HIGH

## 2016-09-07 NOTE — Telephone Encounter (Signed)
Called patient regarding bhcg results & ultrasound appt. Patient verbalized understanding & had no questions

## 2016-09-07 NOTE — Progress Notes (Signed)
Patient here for bhcg today. Patient denies pain or bleeding. Spoke with Jorje Guild who finds favorable increased in BHCG levels. Patient needs follow up ultrasound in 1 week. Scheduled for 4/10 @11am . Will call patient.

## 2016-09-08 ENCOUNTER — Telehealth: Payer: Self-pay | Admitting: *Deleted

## 2016-09-08 NOTE — Telephone Encounter (Signed)
Patient called for b-hcg results.

## 2016-09-15 ENCOUNTER — Ambulatory Visit: Payer: Self-pay

## 2016-09-15 ENCOUNTER — Ambulatory Visit (HOSPITAL_COMMUNITY): Payer: Self-pay

## 2016-09-20 ENCOUNTER — Inpatient Hospital Stay (HOSPITAL_COMMUNITY): Payer: Self-pay

## 2016-09-20 ENCOUNTER — Inpatient Hospital Stay (HOSPITAL_COMMUNITY)
Admission: AD | Admit: 2016-09-20 | Discharge: 2016-09-20 | Disposition: A | Discharge status: Home / Self Care | Payer: Medicaid Other | Source: Ambulatory Visit | Attending: Obstetrics & Gynecology | Admitting: Obstetrics & Gynecology

## 2016-09-20 ENCOUNTER — Encounter (HOSPITAL_COMMUNITY): Payer: Self-pay | Admitting: *Deleted

## 2016-09-20 DIAGNOSIS — F17210 Nicotine dependence, cigarettes, uncomplicated: Secondary | ICD-10-CM | POA: Insufficient documentation

## 2016-09-20 DIAGNOSIS — Z3A01 Less than 8 weeks gestation of pregnancy: Secondary | ICD-10-CM | POA: Insufficient documentation

## 2016-09-20 DIAGNOSIS — O99331 Smoking (tobacco) complicating pregnancy, first trimester: Secondary | ICD-10-CM | POA: Insufficient documentation

## 2016-09-20 DIAGNOSIS — O209 Hemorrhage in early pregnancy, unspecified: Secondary | ICD-10-CM | POA: Diagnosis not present

## 2016-09-20 LAB — URINALYSIS, ROUTINE W REFLEX MICROSCOPIC
BILIRUBIN URINE: NEGATIVE
GLUCOSE, UA: NEGATIVE mg/dL
Hgb urine dipstick: NEGATIVE
Ketones, ur: NEGATIVE mg/dL
Leukocytes, UA: NEGATIVE
NITRITE: NEGATIVE
PH: 6 (ref 5.0–8.0)
Protein, ur: NEGATIVE mg/dL
SPECIFIC GRAVITY, URINE: 1.019 (ref 1.005–1.030)

## 2016-09-20 NOTE — MAU Provider Note (Signed)
Chief Complaint: Vaginal Bleeding and Abdominal Cramping   First Provider Initiated Contact with Patient 09/20/16 1037        SUBJECTIVE HPI: Audrey Dougherty is a 31 y.o. G3P1102 at [redacted]w[redacted]d by LMP who presents to maternity admissions reporting lower abdominal cramps and small amount of vaginal bleeding.  Has an ultrasound followup scheduled for tomorrow.  Previous visits showed appropriate rise in HCG.. She denies vaginal itching/burning, urinary symptoms, h/a, dizziness, n/v, or fever/chills.    Tearful. States she cannot wait for tomorrow to have an ultrasound.  Vaginal Bleeding  The patient's primary symptoms include missed menses, pelvic pain and vaginal bleeding. The patient's pertinent negatives include no genital itching, genital lesions or genital odor. This is a new problem. The current episode started today. The problem occurs intermittently. The problem has been waxing and waning. The pain is mild. The problem affects both sides. She is pregnant. Associated symptoms include abdominal pain. Pertinent negatives include no back pain, constipation, diarrhea, dysuria, fever, headaches, nausea or vomiting. The vaginal discharge was bloody. The vaginal bleeding is spotting. She has not been passing clots. She has not been passing tissue. Nothing aggravates the symptoms. She has tried nothing for the symptoms.  Abdominal Cramping  This is a new problem. The current episode started today. The onset quality is gradual. The problem occurs intermittently. The pain is located in the suprapubic region. The pain is mild. The quality of the pain is cramping. Pertinent negatives include no constipation, diarrhea, dysuria, fever, headaches, nausea or vomiting. Nothing aggravates the pain. The pain is relieved by nothing. She has tried nothing for the symptoms.   RN note: Pt presents to MAU with complaints of lower abdominal cramping with small amount of vaginal bleeding when she wipes  Past Medical History:   Diagnosis Date  . Hx of trichomoniasis 09/05/2016  . Placenta previa   . Preterm labor    Past Surgical History:  Procedure Laterality Date  . CESAREAN SECTION     Social History   Social History  . Marital status: Single    Spouse name: N/A  . Number of children: N/A  . Years of education: N/A   Occupational History  . Not on file.   Social History Main Topics  . Smoking status: Current Every Day Smoker    Packs/day: 0.25    Types: Cigarettes  . Smokeless tobacco: Never Used  . Alcohol use No  . Drug use: No  . Sexual activity: Yes   Other Topics Concern  . Not on file   Social History Narrative  . No narrative on file   No current facility-administered medications on file prior to encounter.    Current Outpatient Prescriptions on File Prior to Encounter  Medication Sig Dispense Refill  . acetaminophen (TYLENOL) 325 MG tablet Take 650 mg by mouth every 6 (six) hours as needed for mild pain.    Marland Kitchen OVER THE COUNTER MEDICATION Take 1 tablet by mouth daily as needed (OTC allergy med).     No Known Allergies  I have reviewed patient's Past Medical Hx, Surgical Hx, Family Hx, Social Hx, medications and allergies.   ROS:  Review of Systems  Constitutional: Negative for fever.  Gastrointestinal: Positive for abdominal pain. Negative for constipation, diarrhea, nausea and vomiting.  Genitourinary: Positive for missed menses, pelvic pain and vaginal bleeding. Negative for dysuria.  Musculoskeletal: Negative for back pain.  Neurological: Negative for headaches.   Review of Systems  Other systems negative   Physical Exam  Physical Exam Patient Vitals for the past 24 hrs:  BP Temp Pulse Resp Weight  09/20/16 1023 101/62 98.2 F (36.8 C) 96 18 151 lb (68.5 kg)   Constitutional: Well-developed, well-nourished female in no acute distress.  Cardiovascular: normal rate Respiratory: normal effort GI: Abd soft, non-tender. Pos BS x 4 MS: Extremities nontender, no  edema, normal ROM Neurologic: Alert and oriented x 4.  GU: Neg CVAT.  PELVIC EXAM: Small amount of pink discharge,  Abdomen nontender.  LAB RESULTS Results for orders placed or performed during the hospital encounter of 09/20/16 (from the past 24 hour(s))  Urinalysis, Routine w reflex microscopic     Status: Abnormal   Collection Time: 09/20/16 10:20 AM  Result Value Ref Range   Color, Urine YELLOW YELLOW   APPearance HAZY (A) CLEAR   Specific Gravity, Urine 1.019 1.005 - 1.030   pH 6.0 5.0 - 8.0   Glucose, UA NEGATIVE NEGATIVE mg/dL   Hgb urine dipstick NEGATIVE NEGATIVE   Bilirubin Urine NEGATIVE NEGATIVE   Ketones, ur NEGATIVE NEGATIVE mg/dL   Protein, ur NEGATIVE NEGATIVE mg/dL   Nitrite NEGATIVE NEGATIVE   Leukocytes, UA NEGATIVE NEGATIVE       IMAGING .US Ob Comp Less 14 Wks  Result Date: 09/05/2016 CLINICAL DATA:  Abdominal pain affecting pregnancy O26.899, R10.9 (ICD-10-CM). Patient is 10 weeks, 4 days pregnant based on her last menstrual period EXAM: OBSTETRIC <14 WK [place] AND TRANSVAGINAL OB US TECHNIQUE: Both transabdominal and transvaginal ultrasound examinations were performed for complete evaluation of the gestation as well as the maternal uterus, adnexal regions, and pelvic cul-de-sac. Transvaginal technique was performed to assess early pregnancy. COMPARISON:  None. FINDINGS: Intrauterine gestational sac: Tiny cystic structure noted in the left upper uterine segment endometrium, which could reflect a gestational sac. No other evidence of an intrauterine pregnancy. Yolk sac:  Not Visualized. Embryo:  Not Visualized. MSD: 2.5  mm   4 w   6  d Subchorionic hemorrhage:  None visualized. Maternal uterus/adnexae: No uterine masses.  Cervix is closed. Right ovarian corpus luteum.  Ovaries unremarkable. There is a 1 cm calcification with posterior acoustic shadowing along the right adnexa near the right ovary. This could reflect an appendicolith but is nonspecific. IMPRESSION: 1.  Possible small intrauterine gestational sac suggesting a 4 week, 6 day pregnancy. This will need correlated with the patient's beta HCG level. No yolk sac or embryo. 2. Right adnexal calcification of unclear etiology. If patient is not pregnant, and if there are symptoms of right pelvic/lower quadrant pain, consider follow-up abdomen pelvis CT with contrast for further assessment. Electronically Signed   By: Lajean Manes M.D.   On: 09/05/2016 09:48   US Ob Transvaginal  Result Date: 09/20/2016 CLINICAL DATA:  First-trimester pregnancy.  Bleeding. EXAM: TRANSVAGINAL OB ULTRASOUND TECHNIQUE: Transvaginal ultrasound was performed for complete evaluation of the gestation as well as the maternal uterus, adnexal regions, and pelvic cul-de-sac. COMPARISON:  Pelvic ultrasound 09/05/2016. FINDINGS: Intrauterine gestational sac: Present Yolk sac:  Present Embryo:  Present Cardiac Activity: Present Heart Rate: 122 bpm CRL:   7.7 mm  mm   6 w 4 d                  [place] EDC: 05/12/2017 Subchorionic hemorrhage: A small subchorionic hemorrhage is present. Maternal uterus/adnexae: The uterus and adnexa are otherwise within normal limits. A shadowing calcification is again noted within the right lower quadrant. IMPRESSION: 1. Normal expected interval growth single intrauterine pregnancy. The fetus and  heart rate are now visualized. 2. Small subchorionic hemorrhage. 3. Stable right lower quadrant shadowing calcification Electronically Signed   By: San Morelle M.D.   On: 09/20/2016 11:43   US Ob Transvaginal  Result Date: 09/05/2016 CLINICAL DATA:  Abdominal pain affecting pregnancy O26.899, R10.9 (ICD-10-CM). Patient is 10 weeks, 4 days pregnant based on her last menstrual period EXAM: OBSTETRIC <14 WK [place] AND TRANSVAGINAL OB US TECHNIQUE: Both transabdominal and transvaginal ultrasound examinations were performed for complete evaluation of the gestation as well as the maternal uterus, adnexal regions, and pelvic  cul-de-sac. Transvaginal technique was performed to assess early pregnancy. COMPARISON:  None. FINDINGS: Intrauterine gestational sac: Tiny cystic structure noted in the left upper uterine segment endometrium, which could reflect a gestational sac. No other evidence of an intrauterine pregnancy. Yolk sac:  Not Visualized. Embryo:  Not Visualized. MSD: 2.5  mm   4 w   6  d Subchorionic hemorrhage:  None visualized. Maternal uterus/adnexae: No uterine masses.  Cervix is closed. Right ovarian corpus luteum.  Ovaries unremarkable. There is a 1 cm calcification with posterior acoustic shadowing along the right adnexa near the right ovary. This could reflect an appendicolith but is nonspecific. IMPRESSION: 1. Possible small intrauterine gestational sac suggesting a 4 week, 6 day pregnancy. This will need correlated with the patient's beta HCG level. No yolk sac or embryo. 2. Right adnexal calcification of unclear etiology. If patient is not pregnant, and if there are symptoms of right pelvic/lower quadrant pain, consider follow-up abdomen pelvis CT with contrast for further assessment. Electronically Signed   By: Lajean Manes M.D.   On: 09/05/2016 09:48     MAU Management/MDM: Reviewed US showed a viable live pregnancy Unclear as to source of bleeding but probably benign Encouraged to seek [facility]    ASSESSMENT 1. Antepartum bleeding, first trimester   2.     Live single IUP at [redacted]w[redacted]d   PLAN Discharge home Start prenatal care Pt stable at time of discharge. Encouraged to return here or to other Urgent Care/ED if she develops worsening of symptoms, increase in pain, fever, or other concerning symptoms.    Hansel Feinstein CNM, MSN Certified Nurse-Midwife 09/20/2016  10:37 AM

## 2016-09-20 NOTE — Discharge Instructions (Signed)
Vaginal Bleeding During Pregnancy, First Trimester A small amount of bleeding (spotting) from the vagina is common in early pregnancy. Sometimes the bleeding is normal and is not a problem, and sometimes it is a sign of something serious. Be sure to tell your doctor about any bleeding from your vagina right away. Follow these instructions at home:  Watch your condition for any changes.  Follow your doctor's instructions about how active you can be.  If you are on bed rest:  You may need to stay in bed and only get up to use the bathroom.  You may be allowed to do some activities.  If you need help, make plans for someone to help you.  Write down:  The number of pads you use each day.  How often you change pads.  How soaked (saturated) your pads are.  Do not use tampons.  Do not douche.  Do not have sex or orgasms until your doctor says it is okay.  If you pass any tissue from your vagina, save the tissue so you can show it to your doctor.  Only take medicines as told by your doctor.  Do not take aspirin because it can make you bleed.  Keep all follow-up visits as told by your doctor. Contact a doctor if:  You bleed from your vagina.  You have cramps.  You have labor pains.  You have a fever that does not go away after you take medicine. Get help right away if:  You have very bad cramps in your back or belly (abdomen).  You pass large clots or tissue from your vagina.  You bleed more.  You feel light-headed or weak.  You pass out (faint).  You have chills.  You are leaking fluid or have a gush of fluid from your vagina.  You pass out while pooping (having a bowel movement). This information is not intended to replace advice given to you by your health care provider. Make sure you discuss any questions you have with your health care provider. Document Released: 10/09/2013 Document Revised: 10/31/2015 Document Reviewed: 01/30/2013 Elsevier Interactive  Patient Education  2017 [facility]  [phone]     Phone: [place]                      [facility]  @[place]     Phone: [phone]  Phone: [place]     Phone: [phone]  Phone: [phone]           [place] [facility] @ [place]                hone: [phone]  Phone: [phone]         [facility] [address] Racer      Phone: [phone]  Phone: [phone]         [place] Dept.                Phone: [phone]  [place])          Phone: [phone] [facility]   Phone: [phone]

## 2016-09-20 NOTE — MAU Note (Signed)
Pt presents to MAU with complaints of lower abdominal cramping with small amount of vaginal bleeding when she wipes

## 2016-09-21 ENCOUNTER — Ambulatory Visit: Payer: Self-pay

## 2016-09-21 ENCOUNTER — Ambulatory Visit (HOSPITAL_COMMUNITY): Admission: RE | Admit: 2016-09-21 | Payer: Self-pay | Source: Ambulatory Visit

## 2016-11-12 LAB — OB RESULTS CONSOLE ABO/RH: RH Type: POSITIVE

## 2016-11-12 LAB — OB RESULTS CONSOLE GC/CHLAMYDIA
CHLAMYDIA, DNA PROBE: NEGATIVE
GC PROBE AMP, GENITAL: NEGATIVE

## 2016-11-12 LAB — OB RESULTS CONSOLE HEPATITIS B SURFACE ANTIGEN: HEP B S AG: NEGATIVE

## 2016-11-12 LAB — OB RESULTS CONSOLE ANTIBODY SCREEN: ANTIBODY SCREEN: NEGATIVE

## 2016-11-12 LAB — OB RESULTS CONSOLE RPR: RPR: NONREACTIVE

## 2016-11-12 LAB — OB RESULTS CONSOLE HIV ANTIBODY (ROUTINE TESTING): HIV: NONREACTIVE

## 2016-11-12 LAB — OB RESULTS CONSOLE RUBELLA ANTIBODY, IGM: Rubella: IMMUNE

## 2017-03-23 ENCOUNTER — Encounter (HOSPITAL_COMMUNITY): Payer: Self-pay

## 2017-04-21 ENCOUNTER — Telehealth (HOSPITAL_COMMUNITY): Payer: Self-pay | Admitting: *Deleted

## 2017-04-21 NOTE — Telephone Encounter (Signed)
Preadmission screen  

## 2017-04-22 ENCOUNTER — Telehealth (HOSPITAL_COMMUNITY): Payer: Self-pay | Admitting: *Deleted

## 2017-04-22 NOTE — Telephone Encounter (Signed)
Preadmission screen  

## 2017-04-26 ENCOUNTER — Telehealth (HOSPITAL_COMMUNITY): Payer: Self-pay | Admitting: *Deleted

## 2017-04-26 NOTE — Telephone Encounter (Signed)
Preadmission screen  

## 2017-04-28 ENCOUNTER — Encounter (HOSPITAL_COMMUNITY): Payer: Self-pay

## 2017-04-28 ENCOUNTER — Telehealth (HOSPITAL_COMMUNITY): Payer: Self-pay | Admitting: *Deleted

## 2017-04-28 NOTE — Telephone Encounter (Signed)
Preadmission screen  

## 2017-05-03 NOTE — Patient Instructions (Signed)
Audrey Dougherty  05/03/2017   Your procedure is scheduled on:  05/05/2017  Enter through the Main Entrance of [facility] [place] up the phone at the desk and dial [phone]  Call this number if you have problems the morning of [phone]  Remember:   Do not eat food:After Midnight.  Do not drink clear liquids: After Midnight.  Take these medicines the morning of surgery with A SIP OF WATER: none   Do not wear jewelry, make-up or nail polish.  Do not wear lotions, powders, or perfumes. Do not wear deodorant.  Do not shave 48 hours prior to surgery.  Do not bring valuables to the hospital.  [facility] is not   responsible for any belongings or valuables brought to the hospital.  Contacts, dentures or bridgework may not be worn into surgery.  Leave suitcase in the car. After surgery it may be brought to your room.  For patients admitted to the hospital, checkout time is 11:00 AM the day of              discharge.    N/A   Please read over the following fact sheets that you were given:   Surgical Site Infection Prevention

## 2017-05-04 ENCOUNTER — Encounter (HOSPITAL_COMMUNITY)
Admission: RE | Admit: 2017-05-04 | Discharge: 2017-05-04 | Disposition: A | Discharge status: Home / Self Care | Payer: Medicaid Other | Source: Ambulatory Visit | Attending: Obstetrics and Gynecology | Admitting: Obstetrics and Gynecology

## 2017-05-04 LAB — CBC
HCT: 33 % — ABNORMAL LOW (ref 36.0–46.0)
Hemoglobin: 11 g/dL — ABNORMAL LOW (ref 12.0–15.0)
MCH: 27 pg (ref 26.0–34.0)
MCHC: 33.3 g/dL (ref 30.0–36.0)
MCV: 81.1 fL (ref 78.0–100.0)
PLATELETS: 181 10*3/uL (ref 150–400)
RBC: 4.07 MIL/uL (ref 3.87–5.11)
RDW: 14.4 % (ref 11.5–15.5)
WBC: 9.4 10*3/uL (ref 4.0–10.5)

## 2017-05-05 ENCOUNTER — Encounter (HOSPITAL_COMMUNITY)
Admission: RE | Disposition: A | Discharge status: Home / Self Care | Payer: Self-pay | Source: Ambulatory Visit | Attending: Obstetrics and Gynecology

## 2017-05-05 ENCOUNTER — Inpatient Hospital Stay (HOSPITAL_COMMUNITY): Payer: Medicaid Other | Admitting: Anesthesiology

## 2017-05-05 ENCOUNTER — Encounter (HOSPITAL_COMMUNITY): Payer: Self-pay | Admitting: Obstetrics and Gynecology

## 2017-05-05 ENCOUNTER — Inpatient Hospital Stay (HOSPITAL_COMMUNITY)
Admission: RE | Admit: 2017-05-05 | Discharge: 2017-05-07 | DRG: 788 | Disposition: A | Discharge status: Home / Self Care | Payer: Medicaid Other | Source: Ambulatory Visit | Attending: Obstetrics and Gynecology | Admitting: Obstetrics and Gynecology

## 2017-05-05 DIAGNOSIS — Z3A39 39 weeks gestation of pregnancy: Secondary | ICD-10-CM

## 2017-05-05 DIAGNOSIS — Z87891 Personal history of nicotine dependence: Secondary | ICD-10-CM | POA: Diagnosis not present

## 2017-05-05 DIAGNOSIS — O34211 Maternal care for low transverse scar from previous cesarean delivery: Secondary | ICD-10-CM | POA: Diagnosis not present

## 2017-05-05 DIAGNOSIS — Z98891 History of uterine scar from previous surgery: Secondary | ICD-10-CM

## 2017-05-05 LAB — RPR: RPR: NONREACTIVE

## 2017-05-05 SURGERY — Surgical Case
Anesthesia: Spinal | Site: Abdomen | Wound class: Clean Contaminated

## 2017-05-05 MED ORDER — LACTATED RINGERS IV SOLN
INTRAVENOUS | Status: DC
  Administered 2017-05-05 (×3): via INTRAVENOUS

## 2017-05-05 MED ORDER — ONDANSETRON HCL 4 MG/2ML IJ SOLN: 4.0000 mg | Freq: Three times a day (TID) | INTRAMUSCULAR | Status: DC | PRN

## 2017-05-05 MED ORDER — PRENATAL MULTIVITAMIN CH
1.0000 | ORAL_TABLET | Freq: Every day | ORAL | Status: DC
  Administered 2017-05-06 – 2017-05-07 (×2): 1 via ORAL
  Filled 2017-05-05 (×2): qty 1

## 2017-05-05 MED ORDER — ACETAMINOPHEN 325 MG PO TABS: 650.0000 mg | ORAL_TABLET | ORAL | Status: DC | PRN

## 2017-05-05 MED ORDER — SIMETHICONE 80 MG PO CHEW
80.0000 mg | CHEWABLE_TABLET | ORAL | Status: DC
  Administered 2017-05-05 – 2017-05-06 (×2): 80 mg via ORAL
  Filled 2017-05-05 (×2): qty 1

## 2017-05-05 MED ORDER — NALBUPHINE HCL 10 MG/ML IJ SOLN: 5.0000 mg | Freq: Once | INTRAMUSCULAR | Status: DC | PRN

## 2017-05-05 MED ORDER — ONDANSETRON HCL 4 MG/2ML IJ SOLN
INTRAMUSCULAR | Status: AC
  Filled 2017-05-05: qty 2

## 2017-05-05 MED ORDER — OXYCODONE HCL 5 MG/5ML PO SOLN
5.0000 mg | Freq: Once | ORAL | Status: DC | PRN
Start: 2017-05-05 — End: 2017-05-05

## 2017-05-05 MED ORDER — NALBUPHINE HCL 10 MG/ML IJ SOLN: 5.0000 mg | INTRAMUSCULAR | Status: DC | PRN

## 2017-05-05 MED ORDER — FENTANYL CITRATE (PF) 100 MCG/2ML IJ SOLN
INTRAMUSCULAR | Status: DC | PRN
  Administered 2017-05-05: 25 ug via INTRAVENOUS
  Administered 2017-05-05: 10 ug via INTRATHECAL
  Administered 2017-05-05: 25 ug via INTRAVENOUS
  Administered 2017-05-05: 40 ug via INTRAVENOUS

## 2017-05-05 MED ORDER — LACTATED RINGERS IV SOLN
INTRAVENOUS | Status: DC
  Administered 2017-05-05 (×2): via INTRAVENOUS

## 2017-05-05 MED ORDER — DIPHENHYDRAMINE HCL 50 MG/ML IJ SOLN: 12.5000 mg | INTRAMUSCULAR | Status: DC | PRN

## 2017-05-05 MED ORDER — COCONUT OIL OIL: 1.0000 "application " | TOPICAL_OIL | Status: DC | PRN

## 2017-05-05 MED ORDER — SENNOSIDES-DOCUSATE SODIUM 8.6-50 MG PO TABS
2.0000 | ORAL_TABLET | ORAL | Status: DC
  Administered 2017-05-05 – 2017-05-06 (×2): 2 via ORAL
  Filled 2017-05-05 (×2): qty 2

## 2017-05-05 MED ORDER — OXYTOCIN 40 UNITS IN LACTATED RINGERS INFUSION - SIMPLE MED: 2.5000 [IU]/h | INTRAVENOUS | Status: AC

## 2017-05-05 MED ORDER — HYDROMORPHONE HCL 1 MG/ML IJ SOLN: 0.2500 mg | INTRAMUSCULAR | Status: DC | PRN

## 2017-05-05 MED ORDER — BUPIVACAINE IN DEXTROSE 0.75-8.25 % IT SOLN
INTRATHECAL | Status: DC | PRN
  Administered 2017-05-05: 1.8 mg via INTRATHECAL

## 2017-05-05 MED ORDER — NALOXONE HCL 0.4 MG/ML IJ SOLN: 0.4000 mg | INTRAMUSCULAR | Status: DC | PRN

## 2017-05-05 MED ORDER — DIPHENHYDRAMINE HCL 25 MG PO CAPS: 25.0000 mg | ORAL_CAPSULE | ORAL | Status: DC | PRN

## 2017-05-05 MED ORDER — IBUPROFEN 600 MG PO TABS
600.0000 mg | ORAL_TABLET | Freq: Four times a day (QID) | ORAL | Status: DC
  Administered 2017-05-06 – 2017-05-07 (×5): 600 mg via ORAL
  Filled 2017-05-05 (×5): qty 1

## 2017-05-05 MED ORDER — TETANUS-DIPHTH-ACELL PERTUSSIS 5-2.5-18.5 LF-MCG/0.5 IM SUSP: 0.5000 mL | Freq: Once | INTRAMUSCULAR | Status: DC

## 2017-05-05 MED ORDER — MORPHINE SULFATE (PF) 0.5 MG/ML IJ SOLN
INTRAMUSCULAR | Status: AC
  Filled 2017-05-05: qty 10

## 2017-05-05 MED ORDER — MEPERIDINE HCL 25 MG/ML IJ SOLN: 6.2500 mg | INTRAMUSCULAR | Status: DC | PRN

## 2017-05-05 MED ORDER — WITCH HAZEL-GLYCERIN EX PADS: 1.0000 "application " | MEDICATED_PAD | CUTANEOUS | Status: DC | PRN

## 2017-05-05 MED ORDER — OXYTOCIN 10 UNIT/ML IJ SOLN
INTRAMUSCULAR | Status: AC
  Filled 2017-05-05: qty 4

## 2017-05-05 MED ORDER — DIBUCAINE 1 % RE OINT: 1.0000 "application " | TOPICAL_OINTMENT | RECTAL | Status: DC | PRN

## 2017-05-05 MED ORDER — KETOROLAC TROMETHAMINE 30 MG/ML IJ SOLN
INTRAMUSCULAR | Status: AC
  Filled 2017-05-05: qty 1

## 2017-05-05 MED ORDER — SODIUM CHLORIDE 0.9% FLUSH: 3.0000 mL | INTRAVENOUS | Status: DC | PRN

## 2017-05-05 MED ORDER — KETOROLAC TROMETHAMINE 30 MG/ML IJ SOLN
30.0000 mg | Freq: Four times a day (QID) | INTRAMUSCULAR | Status: AC | PRN
  Administered 2017-05-05: 30 mg via INTRAMUSCULAR

## 2017-05-05 MED ORDER — ONDANSETRON HCL 4 MG/2ML IJ SOLN: 4.0000 mg | Freq: Four times a day (QID) | INTRAMUSCULAR | Status: DC | PRN

## 2017-05-05 MED ORDER — LACTATED RINGERS IV BOLUS (SEPSIS)
1000.0000 mL | Freq: Once | INTRAVENOUS | Status: AC
  Administered 2017-05-05: 1000 mL via INTRAVENOUS

## 2017-05-05 MED ORDER — OXYCODONE HCL 5 MG PO TABS: 10.0000 mg | ORAL_TABLET | ORAL | Status: DC | PRN

## 2017-05-05 MED ORDER — NALOXONE HCL 0.4 MG/ML IJ SOLN
1.0000 ug/kg/h | INTRAVENOUS | Status: DC | PRN
  Filled 2017-05-05: qty 5

## 2017-05-05 MED ORDER — MENTHOL 3 MG MT LOZG: 1.0000 | LOZENGE | OROMUCOSAL | Status: DC | PRN

## 2017-05-05 MED ORDER — KETOROLAC TROMETHAMINE 30 MG/ML IJ SOLN
30.0000 mg | Freq: Four times a day (QID) | INTRAMUSCULAR | Status: AC
  Administered 2017-05-05 – 2017-05-06 (×2): 30 mg via INTRAVENOUS
  Filled 2017-05-05 (×2): qty 1

## 2017-05-05 MED ORDER — SOD CITRATE-CITRIC ACID 500-334 MG/5ML PO SOLN: 30.0000 mL | ORAL | Status: DC

## 2017-05-05 MED ORDER — ZOLPIDEM TARTRATE 5 MG PO TABS: 5.0000 mg | ORAL_TABLET | Freq: Every evening | ORAL | Status: DC | PRN

## 2017-05-05 MED ORDER — OXYCODONE HCL 5 MG PO TABS
5.0000 mg | ORAL_TABLET | ORAL | Status: DC | PRN
  Administered 2017-05-06: 5 mg via ORAL
  Filled 2017-05-05: qty 1

## 2017-05-05 MED ORDER — CEFAZOLIN SODIUM-DEXTROSE 2-4 GM/100ML-% IV SOLN
2.0000 g | INTRAVENOUS | Status: AC
  Administered 2017-05-05: 2 g via INTRAVENOUS

## 2017-05-05 MED ORDER — EPHEDRINE 5 MG/ML INJ
INTRAVENOUS | Status: AC
  Filled 2017-05-05: qty 10

## 2017-05-05 MED ORDER — ONDANSETRON HCL 4 MG/2ML IJ SOLN
INTRAMUSCULAR | Status: DC | PRN
  Administered 2017-05-05: 4 mg via INTRAVENOUS

## 2017-05-05 MED ORDER — SIMETHICONE 80 MG PO CHEW
80.0000 mg | CHEWABLE_TABLET | Freq: Three times a day (TID) | ORAL | Status: DC
  Administered 2017-05-05 – 2017-05-07 (×5): 80 mg via ORAL
  Filled 2017-05-05 (×5): qty 1

## 2017-05-05 MED ORDER — PHENYLEPHRINE 8 MG IN D5W 100 ML (0.08MG/ML) PREMIX OPTIME
INJECTION | INTRAVENOUS | Status: DC | PRN
  Administered 2017-05-05: 60 ug/min via INTRAVENOUS

## 2017-05-05 MED ORDER — EPHEDRINE SULFATE-NACL 50-0.9 MG/10ML-% IV SOSY
PREFILLED_SYRINGE | INTRAVENOUS | Status: DC | PRN
  Administered 2017-05-05: 5 mg via INTRAVENOUS
  Administered 2017-05-05 (×3): 10 mg via INTRAVENOUS

## 2017-05-05 MED ORDER — SCOPOLAMINE 1 MG/3DAYS TD PT72
1.0000 | MEDICATED_PATCH | Freq: Once | TRANSDERMAL | Status: DC
  Administered 2017-05-05: 1.5 mg via TRANSDERMAL

## 2017-05-05 MED ORDER — IBUPROFEN 600 MG PO TABS: 600.0000 mg | ORAL_TABLET | Freq: Four times a day (QID) | ORAL | Status: DC

## 2017-05-05 MED ORDER — OXYCODONE HCL 5 MG PO TABS: 5.0000 mg | ORAL_TABLET | Freq: Once | ORAL | Status: DC | PRN

## 2017-05-05 MED ORDER — MORPHINE SULFATE (PF) 0.5 MG/ML IJ SOLN
INTRAMUSCULAR | Status: DC | PRN
  Administered 2017-05-05: .2 mg via EPIDURAL

## 2017-05-05 MED ORDER — KETOROLAC TROMETHAMINE 30 MG/ML IJ SOLN: 30.0000 mg | Freq: Four times a day (QID) | INTRAMUSCULAR | Status: AC | PRN

## 2017-05-05 MED ORDER — GLYCOPYRROLATE 0.2 MG/ML IJ SOLN
INTRAMUSCULAR | Status: DC | PRN
  Administered 2017-05-05: 0.2 mg via INTRAVENOUS

## 2017-05-05 MED ORDER — DIPHENHYDRAMINE HCL 25 MG PO CAPS: 25.0000 mg | ORAL_CAPSULE | Freq: Four times a day (QID) | ORAL | Status: DC | PRN

## 2017-05-05 MED ORDER — OXYTOCIN 10 UNIT/ML IJ SOLN
INTRAVENOUS | Status: DC | PRN
  Administered 2017-05-05: 40 [IU] via INTRAVENOUS

## 2017-05-05 MED ORDER — SIMETHICONE 80 MG PO CHEW: 80.0000 mg | CHEWABLE_TABLET | ORAL | Status: DC | PRN

## 2017-05-05 MED ORDER — PHENYLEPHRINE 8 MG IN D5W 100 ML (0.08MG/ML) PREMIX OPTIME
INJECTION | INTRAVENOUS | Status: AC
  Filled 2017-05-05: qty 100

## 2017-05-05 MED ORDER — SOD CITRATE-CITRIC ACID 500-334 MG/5ML PO SOLN
ORAL | Status: AC
  Filled 2017-05-05: qty 15

## 2017-05-05 MED ORDER — GLYCOPYRROLATE 0.2 MG/ML IJ SOLN
INTRAMUSCULAR | Status: AC
  Filled 2017-05-05: qty 1

## 2017-05-05 MED ORDER — FENTANYL CITRATE (PF) 100 MCG/2ML IJ SOLN
INTRAMUSCULAR | Status: AC
  Filled 2017-05-05: qty 2

## 2017-05-05 MED ORDER — SCOPOLAMINE 1 MG/3DAYS TD PT72
MEDICATED_PATCH | TRANSDERMAL | Status: AC
  Filled 2017-05-05: qty 1

## 2017-05-05 SURGICAL SUPPLY — 40 items
APL SKNCLS STERI-STRIP NONHPOA (GAUZE/BANDAGES/DRESSINGS) ×1
BENZOIN TINCTURE PRP APPL 2/3 (GAUZE/BANDAGES/DRESSINGS) ×3 IMPLANT
CHLORAPREP W/TINT 26ML (MISCELLANEOUS) ×3 IMPLANT
CLAMP CORD UMBIL (MISCELLANEOUS) IMPLANT
CLOSURE WOUND 1/2 X4 (GAUZE/BANDAGES/DRESSINGS) ×1
CLOTH BEACON ORANGE TIMEOUT ST (SAFETY) ×3 IMPLANT
DRSG OPSITE POSTOP 4X10 (GAUZE/BANDAGES/DRESSINGS) ×3 IMPLANT
ELECT REM PT RETURN 9FT ADLT (ELECTROSURGICAL) ×3
ELECTRODE REM PT RTRN 9FT ADLT (ELECTROSURGICAL) ×1 IMPLANT
EXTRACTOR VACUUM M CUP 4 TUBE (SUCTIONS) IMPLANT
EXTRACTOR VACUUM M CUP 4' TUBE (SUCTIONS)
GAUZE SPONGE 4X4 12PLY STRL LF (GAUZE/BANDAGES/DRESSINGS) ×2 IMPLANT
GLOVE BIO SURGEON STRL SZ 6 (GLOVE) ×2 IMPLANT
GLOVE BIO SURGEON STRL SZ 6.5 (GLOVE) ×2 IMPLANT
GLOVE BIO SURGEONS STRL SZ 6.5 (GLOVE) ×2
GLOVE BIOGEL PI IND STRL 7.0 (GLOVE) ×2 IMPLANT
GLOVE BIOGEL PI IND STRL 7.5 (GLOVE) ×2 IMPLANT
GLOVE BIOGEL PI INDICATOR 7.0 (GLOVE) ×12
GLOVE BIOGEL PI INDICATOR 7.5 (GLOVE) ×4
GLOVE ECLIPSE 7.5 STRL STRAW (GLOVE) ×3 IMPLANT
GOWN STRL REUS W/TWL LRG LVL3 (GOWN DISPOSABLE) ×9 IMPLANT
HEMOSTAT ARISTA ABSORB 3G PWDR (MISCELLANEOUS) ×2 IMPLANT
KIT ABG SYR 3ML LUER SLIP (SYRINGE) IMPLANT
NDL HYPO 25X5/8 SAFETYGLIDE (NEEDLE) IMPLANT
NEEDLE HYPO 25X5/8 SAFETYGLIDE (NEEDLE) IMPLANT
NS IRRIG 1000ML POUR BTL (IV SOLUTION) ×3 IMPLANT
PACK C SECTION WH (CUSTOM PROCEDURE TRAY) ×3 IMPLANT
PAD ABD 7.5X8 STRL (GAUZE/BANDAGES/DRESSINGS) ×2 IMPLANT
PAD OB MATERNITY 4.3X12.25 (PERSONAL CARE ITEMS) ×3 IMPLANT
PENCIL SMOKE EVAC W/HOLSTER (ELECTROSURGICAL) ×3 IMPLANT
RTRCTR C-SECT PINK 25CM LRG (MISCELLANEOUS) ×3 IMPLANT
SPONGE LAP 18X18 X RAY DECT (DISPOSABLE) ×6 IMPLANT
STRIP CLOSURE SKIN 1/2X4 (GAUZE/BANDAGES/DRESSINGS) ×2 IMPLANT
SUT VIC AB 0 CT1 36 (SUTURE) ×3 IMPLANT
SUT VIC AB 2-0 CT1 (SUTURE) ×12 IMPLANT
SUT VIC AB 2-0 CT1 27 (SUTURE) ×3
SUT VIC AB 2-0 CT1 TAPERPNT 27 (SUTURE) ×1 IMPLANT
SUT VIC AB 4-0 KS 27 (SUTURE) ×3 IMPLANT
TOWEL OR 17X24 6PK STRL BLUE (TOWEL DISPOSABLE) ×3 IMPLANT
TRAY FOLEY BAG SILVER LF 14FR (SET/KITS/TRAYS/PACK) ×3 IMPLANT

## 2017-05-05 NOTE — Addendum Note (Signed)
Addendum  created 05/05/17 1946 by Raenette Rover, CRNA   Sign clinical note

## 2017-05-05 NOTE — Anesthesia Preprocedure Evaluation (Signed)
Anesthesia Evaluation  Patient identified by MRN, date of birth, ID band Patient awake    Reviewed: Allergy & Precautions, H&P , NPO status , Patient's Chart, lab work & pertinent test results  Airway Mallampati: I   Neck ROM: full    Dental   Pulmonary former smoker,    breath sounds clear to auscultation       Cardiovascular negative cardio ROS   Rhythm:regular Rate:Normal     Neuro/Psych    GI/Hepatic   Endo/Other    Renal/GU      Musculoskeletal   Abdominal   Peds  Hematology   Anesthesia Other Findings   Reproductive/Obstetrics (+) Pregnancy                             Anesthesia Physical Anesthesia Plan  ASA: I  Anesthesia Plan: Spinal   Post-op Pain Management:    Induction: Intravenous  PONV Risk Score and Plan: 2 and Ondansetron, Treatment may vary due to age or medical condition and Scopolamine patch - Pre-op  Airway Management Planned: Simple Face Mask  Additional Equipment:   Intra-op Plan:   Post-operative Plan:   Informed Consent: I have reviewed the patients History and Physical, chart, labs and discussed the procedure including the risks, benefits and alternatives for the proposed anesthesia with the patient or authorized representative who has indicated his/her understanding and acceptance.     Plan Discussed with: CRNA, Anesthesiologist and Surgeon  Anesthesia Plan Comments:         Anesthesia Quick Evaluation

## 2017-05-05 NOTE — Anesthesia Postprocedure Evaluation (Signed)
Anesthesia Post Note  Patient: Audrey Dougherty  Procedure(s) Performed: REPEAT CESAREAN SECTION (N/A Abdomen)     Patient location during evaluation: Mother Baby Anesthesia Type: Spinal Level of consciousness: awake, awake and alert, oriented and patient cooperative Pain management: pain level controlled Vital Signs Assessment: post-procedure vital signs reviewed and stable Respiratory status: spontaneous breathing, nonlabored ventilation and respiratory function stable Cardiovascular status: stable Postop Assessment: no headache, no backache, patient able to bend at knees and no apparent nausea or vomiting Anesthetic complications: no    Last Vitals:  Vitals:   05/05/17 1730 05/05/17 1830  BP:    Pulse:    Resp:    Temp:    SpO2: 93% 97%    Last Pain:  Vitals:   05/05/17 1730  TempSrc:   PainSc: 0-No pain   Pain Goal:                 Amila Callies L

## 2017-05-05 NOTE — Anesthesia Procedure Notes (Signed)
Spinal  Patient location during procedure: OR Start time: 05/05/2017 9:47 AM End time: 05/05/2017 9:51 AM Staffing Anesthesiologist: Albertha Ghee, MD Performed: anesthesiologist  Preanesthetic Checklist Completed: patient identified, surgical consent, pre-op evaluation, timeout performed, IV checked, risks and benefits discussed and monitors and equipment checked Spinal Block Patient position: sitting Prep: DuraPrep Patient monitoring: cardiac monitor, continuous pulse ox and blood pressure Approach: midline Location: L3-4 Injection technique: single-shot Needle Needle type: Pencan  Needle gauge: 24 G Needle length: 9 cm Assessment Sensory level: T10 Additional Notes Functioning IV was confirmed and monitors were applied. Sterile prep and drape, including hand hygiene and sterile gloves were used. The patient was positioned and the spine was prepped. The skin was anesthetized with lidocaine.  Free flow of clear CSF was obtained prior to injecting local anesthetic into the CSF.  The spinal needle aspirated freely following injection.  The needle was carefully withdrawn.  The patient tolerated the procedure well.

## 2017-05-05 NOTE — Transfer of Care (Signed)
Immediate Anesthesia Transfer of Care Note  Patient: Audrey Dougherty  Procedure(s) Performed: REPEAT CESAREAN SECTION (N/A Abdomen)  Patient Location: PACU  Anesthesia Type:Spinal  Level of Consciousness: awake  Airway & Oxygen Therapy: Patient Spontanous Breathing  Post-op Assessment: Report given to RN  Post vital signs: Reviewed and stable  Last Vitals:  Vitals:   05/05/17 0903  BP: 135/78  Pulse: 82  Resp: 20  Temp: 36.7 C    Last Pain:  Vitals:   05/05/17 0903  TempSrc: Oral         Complications: No apparent anesthesia complications

## 2017-05-05 NOTE — Op Note (Addendum)
Audrey Dougherty PROCEDURE DATE: 05/05/2017  PREOPERATIVE DIAGNOSES: Intrauterine pregnancy at [redacted]w[redacted]d weeks gestation; previous c-section x2  POSTOPERATIVE DIAGNOSES: The same  PROCEDURE: Repeat Low Transverse Cesarean Section  SURGEON:  Dr. Laurey Arrow  ASSISTANT:  Dr. Luiz Blare  ANESTHESIOLOGY TEAM: Anesthesiologist: Albertha Ghee, MD CRNA: Asher Muir, CRNA  INDICATIONS: Audrey Dougherty is a 31 y.o. [id number] at [redacted]w[redacted]d here for cesarean section secondary to the indications listed under preoperative diagnoses; please see preoperative note for further details.  The risks of cesarean section were discussed with the patient including but were not limited to: bleeding which may require transfusion or reoperation; infection which may require antibiotics; injury to bowel, bladder, ureters or other surrounding organs; injury to the fetus; need for additional procedures including hysterectomy in the event of a life-threatening hemorrhage; placental abnormalities wth subsequent pregnancies, incisional problems, thromboembolic phenomenon and other postoperative/anesthesia complications.   The patient concurred with the proposed plan, giving informed written consent for the procedure.    FINDINGS:  Viable female infant in cephalic presentation.  Apgars 8 and 9.  Clear amniotic fluid.  Intact placenta, three vessel cord.  Normal uterus, fallopian tubes. Moderate adhesions to the rectus muscles.   ANESTHESIA: Spinal INTRAVENOUS FLUIDS: 2600 ml   ESTIMATED BLOOD LOSS: 990 ml URINE OUTPUT: 150 ml SPECIMENS: Placenta sent to L&D COMPLICATIONS: moderate rectus adhesions and uterine adhesions. Moderately deep extension down lower uterine segment.   PROCEDURE IN DETAIL:  The patient preoperatively received intravenous antibiotics and had sequential compression devices applied to her lower extremities.  She was then taken to the operating room where spinal anesthesia was administered and was  found to be adequate. She was then placed in a dorsal supine position with a leftward tilt, and prepped and draped in a sterile manner.  A foley catheter was placed into her bladder and attached to constant gravity.  After an adequate timeout was performed, a Pfannenstiel skin incision was made with scalpel and carried through to the underlying layer of fascia. The fascia was incised in the midline, and this incision was extended bilaterally using the Mayo scissors.  Kocher clamps were applied to the superior aspect of the fascial incision and the underlying rectus muscles were dissected off bluntly.  A similar process was carried out on the inferior aspect of the fascial incision. The rectus muscles were separated in the midline sharply and the peritoneum was entered bluntly. Moderate adhesions to rectus muscles appreciated when entering peritoneum. A bladder flap was created. Attention was turned to the lower uterine segment. A small window was appreciated. A low transverse hysterotomy was made with a scalpel and extended bilaterally bluntly above this window.  The infant was successfully delivered, the cord was clamped and cut after one minute, and the infant was handed over to the awaiting neonatology team. Uterine massage was then administered, and the placenta delivered intact with a three-vessel cord. The uterus was then cleared of clots and debris. Upon inspecting uterus an extension to the lower uterine segment was appreciated. This was reapproximated with 2-0-Vicryl prior to hysterotomy closure.  The hysterotomy was closed with 2-0 Vicryl in a running locked fashion, and an imbricating layer was also placed with 2-0 Vicryl.  Figure-of-eight 2-0 Vicryl serosal stitches were placed to help with hemostasis.  The pelvis was cleared of all clot and debris. Hemostasis was confirmed on all surfaces. Arista was placed. The peritoneum was closed with a 0 Vicryl running stitch. However prior to fascial closure dark  bleeding  was appreciated from peritoneum. Peritoneum was then re-entered. Dr. Roselie Awkward was called to room for assistance to help with bleeding. Noted to have right rectus muscle bleeding on peritoneal side. Bovie used for hemostasis. Good hemostasis noted. The rectus muscle was then reapproximated using 2-0 Vicryl 1 interrupted stitch. The fascia was then closed using 0 Vicryl in a running fashion.  The subcutaneous layer was irrigated.  The skin was closed with a 4-0 Vicryl subcuticular stitch. The patient tolerated the procedure well. Sponge, lap, instrument and needle counts were correct x 3.  She was taken to the recovery room in stable condition.    Luiz Blare, DO OB Fellow  Attestation of Attending Supervision of Provider:  Evaluation and management procedures were performed by this provider under my supervision and collaboration. I have reviewed the provider's note and chart, and I agree with the management and plan.  Laurey Arrow, MD Faculty Practice, [facility]

## 2017-05-05 NOTE — H&P (Signed)
LABOR AND DELIVERY ADMISSION HISTORY AND PHYSICAL NOTE  Audrey Dougherty is a 31 y.o. female [phone] with IUP at [redacted]w[redacted]d by 7 wk u/s presenting for rltcs.   She reports positive fetal movement. She denies leakage of fluid or vaginal bleeding.  Prenatal History/Complications:  Past Medical History: Past Medical History:  Diagnosis Date  . Hx of trichomoniasis 09/05/2016  . Placenta previa   . Preterm labor     Past Surgical History: Past Surgical History:  Procedure Laterality Date  . CESAREAN SECTION      Obstetrical History: OB History    Gravida Para Term Preterm AB Living   3 2 1 1   2    SAB TAB Ectopic Multiple Live Births           2      Social History: Social History   Socioeconomic History  . Marital status: Single    Spouse name: Not on file  . Number of children: Not on file  . Years of education: Not on file  . Highest education level: Not on file  Social Needs  . Financial resource strain: Not on file  . Food insecurity - worry: Not on file  . Food insecurity - inability: Not on file  . Transportation needs - medical: Not on file  . Transportation needs - non-medical: Not on file  Occupational History  . Not on file  Tobacco Use  . Smoking status: Former Smoker    Packs/day: 0.25    Types: Cigarettes    Last attempt to quit: 01/26/2017    Years since quitting: 0.2  . Smokeless tobacco: Never Used  Substance and Sexual Activity  . Alcohol use: No  . Drug use: No  . Sexual activity: Yes  Other Topics Concern  . Not on file  Social History Narrative  . Not on file    Family History: Family History  Problem Relation Age of Onset  . Diabetes Paternal Grandmother   . Anesthesia problems Neg Hx   . Hypotension Neg Hx   . Malignant hyperthermia Neg Hx   . Pseudochol deficiency Neg Hx     Allergies: No Known Allergies  Medications Prior to Admission  Medication Sig Dispense Refill Last Dose  . Prenatal Vit-Fe Fumarate-FA (PRENATAL  MULTIVITAMIN) TABS tablet Take 1 tablet by mouth daily at 12 noon.   09/19/2016 at Unknown time     Review of Systems   All systems reviewed and negative except as stated in HPI  Blood pressure 135/78, pulse 82, temperature 98.1 F (36.7 C), temperature source Oral, resp. rate 20, height 5\' 5"  (1.651 m), weight 160 lb (72.6 kg), last menstrual period 06/23/2016, currently breastfeeding. General appearance: alert, cooperative and appears stated age Lungs: clear to auscultation bilaterally Heart: regular rate and rhythm Abdomen: soft, non-tender; bowel sounds normal Extremities: No calf swelling or tenderness     Prenatal labs: ABO, Rh: --/--/O POS (11/27 1017) Antibody: POS (11/27 0955) Rubella: Immune (06/07 0000) RPR: Non Reactive (11/27 0955)  HBsAg: Negative (06/07 0000)  HIV: Non-reactive (06/07 0000)  GBS:   neg 1 hr Glucola: wnl Genetic screening:  wnl Anatomy US: wnl  Prenatal Transfer Tool  Maternal Diabetes: No Genetic Screening: Normal Maternal Ultrasounds/Referrals: Normal Fetal Ultrasounds or other Referrals:  None Maternal Substance Abuse:  smoking Significant Maternal Medications:  None Significant Maternal Lab Results: Lab values include: Group B Strep negative  Results for orders placed or performed during the hospital encounter of 05/04/17 (from the past  24 hour(s))  CBC   Collection Time: 05/04/17  9:55 AM  Result Value Ref Range   WBC 9.4 4.0 - 10.5 K/uL   RBC 4.07 3.87 - 5.11 MIL/uL   Hemoglobin 11.0 (L) 12.0 - 15.0 g/dL   HCT 33.0 (L) 36.0 - 46.0 %   MCV 81.1 78.0 - 100.0 fL   MCH 27.0 26.0 - 34.0 pg   MCHC 33.3 30.0 - 36.0 g/dL   RDW 14.4 11.5 - 15.5 %   Platelets 181 150 - 400 K/uL  RPR   Collection Time: 05/04/17  9:55 AM  Result Value Ref Range   RPR Ser Ql Non Reactive Non Reactive  Type and screen Gunter   Collection Time: 05/04/17  9:55 AM  Result Value Ref Range   ABO/RH(D) O POS    Antibody Screen POS     Sample Expiration 05/07/2017    Antibody Identification ANTI E    PT AG Type NEGATIVE FOR E ANTIGEN    Unit Number [id number]    Blood Component Type RED CELLS,LR    Unit division 00    Status of Unit ALLOCATED    Donor AG Type NEGATIVE FOR E ANTIGEN    Transfusion Status OK TO TRANSFUSE    Crossmatch Result COMPATIBLE    Unit Number [id number]    Blood Component Type RED CELLS,LR    Unit division 00    Status of Unit ALLOCATED    Donor AG Type NEGATIVE FOR E ANTIGEN    Transfusion Status OK TO TRANSFUSE    Crossmatch Result COMPATIBLE   BPAM RBC   Collection Time: 05/04/17  9:55 AM  Result Value Ref Range   Blood Product Unit Number [id number]    Unit Type and Rh 5100    Blood Product Expiration Date [id number]    Blood Product Unit Number [id number]    Unit Type and Rh 5100    Blood Product Expiration Date [id number]     There are no active problems to display for this patient.   Assessment: Audrey Dougherty is a 31 y.o. [id number] at [redacted]w[redacted]d here for rltcs  #Labor: The risks of cesarean section were discussed with the patient including but were not limited to: bleeding which may require transfusion or reoperation; infection which may require antibiotics; injury to bowel, bladder, ureters or other surrounding organs; injury to the fetus; need for additional procedures including hysterectomy in the event of a life-threatening hemorrhage; placental abnormalities wth subsequent pregnancies, incisional problems, thromboembolic phenomenon and other postoperative/anesthesia complications. After detailed discussion of risks and benefits patient DOES consent to blood products during surgery should they be deemed necessary. Anesthesia and OR aware.  Preoperative prophylactic antibiotics and SCDs ordered on call to the OR.  To OR when ready.  #ID:  gbs neg #MOF: bottle #MOC: depo #Circ:  Yes, inpt, pt aware of cost differential  Desma Maxim 05/05/2017, 9:23  AM

## 2017-05-05 NOTE — Progress Notes (Signed)
Called to patient's room to assess postpartum c/s bleeding. Patient with saturated dressing.   Upon arrival to room patient has moderately saturated pressure dressing with completely saturated honeycomb dressing. Dressings removed. Minimal dark red blood noted coming from in between steri-strips. No hematomas seen. Patient with appropriate tenderness to abdomen adter surgery. Orders given to reapply new ones. Will reassess bleeding in one hour. Patient denies any symptoms of anemia.   C/s complicated by adhesions and bleeding from scar tissue. Procedure ended around 11am.   Discussed with patient possible need to return to OR. Will monitor and reassess.   Luiz Blare, DO OB Fellow

## 2017-05-05 NOTE — Anesthesia Postprocedure Evaluation (Signed)
Anesthesia Post Note  Patient: Audrey Dougherty  Procedure(s) Performed: REPEAT CESAREAN SECTION (N/A Abdomen)     Patient location during evaluation: PACU Anesthesia Type: Spinal Level of consciousness: oriented and awake and alert Pain management: pain level controlled Vital Signs Assessment: post-procedure vital signs reviewed and stable Respiratory status: spontaneous breathing, respiratory function stable and patient connected to nasal cannula oxygen Cardiovascular status: blood pressure returned to baseline and stable Postop Assessment: no headache, no backache and no apparent nausea or vomiting Anesthetic complications: no    Last Vitals:  Vitals:   05/05/17 1300 05/05/17 1319  BP: (!) 89/70 98/64  Pulse: 79 76  Resp: 18 18  Temp:  36.6 C  SpO2: 97% 96%    Last Pain:  Vitals:   05/05/17 1319  TempSrc: Oral   Pain Goal:                 Jazen Spraggins S

## 2017-05-06 ENCOUNTER — Other Ambulatory Visit: Payer: Self-pay

## 2017-05-06 ENCOUNTER — Encounter (HOSPITAL_COMMUNITY): Payer: Self-pay | Admitting: *Deleted

## 2017-05-06 LAB — CBC
HCT: 22.4 % — ABNORMAL LOW (ref 36.0–46.0)
HCT: 21.2 % — ABNORMAL LOW (ref 36.0–46.0)
Hemoglobin: 7.6 g/dL — ABNORMAL LOW (ref 12.0–15.0)
Hemoglobin: 7.2 g/dL — ABNORMAL LOW (ref 12.0–15.0)
MCH: 27.6 pg (ref 26.0–34.0)
MCH: 27.8 pg (ref 26.0–34.0)
MCHC: 33.9 g/dL (ref 30.0–36.0)
MCHC: 34 g/dL (ref 30.0–36.0)
MCV: 81.5 fL (ref 78.0–100.0)
MCV: 81.9 fL (ref 78.0–100.0)
PLATELETS: 135 10*3/uL — AB (ref 150–400)
Platelets: 145 10*3/uL — ABNORMAL LOW (ref 150–400)
RBC: 2.75 MIL/uL — ABNORMAL LOW (ref 3.87–5.11)
RBC: 2.59 MIL/uL — ABNORMAL LOW (ref 3.87–5.11)
RDW: 14.6 % (ref 11.5–15.5)
RDW: 14.5 % (ref 11.5–15.5)
WBC: 10.8 10*3/uL — ABNORMAL HIGH (ref 4.0–10.5)
WBC: 10 10*3/uL (ref 4.0–10.5)

## 2017-05-06 LAB — BIRTH TISSUE RECOVERY COLLECTION (PLACENTA DONATION)

## 2017-05-06 MED ORDER — FERROUS SULFATE 325 (65 FE) MG PO TABS
325.0000 mg | ORAL_TABLET | Freq: Two times a day (BID) | ORAL | Status: DC
  Administered 2017-05-06 – 2017-05-07 (×2): 325 mg via ORAL
  Filled 2017-05-06 (×2): qty 1

## 2017-05-06 NOTE — Progress Notes (Signed)
POSTPARTUM PROGRESS NOTE  Post Partum Day 1 Subjective:  Audrey Dougherty is a 31 y.o. G9E0100 [redacted]w[redacted]d s/p rLTCS.  There was concern for bleeding from incision last night as blood was visible to pressure dressing above honeycomb.   Dressing was changed and wound checked by Dr. Gerarda Fraction per progress note last night.  Pt denies problems with ambulating, voiding or po intake.  She denies nausea or vomiting.  Pain is well controlled.  She has had flatus. She has not had bowel movement.  Lochia Minimal.   Objective: Blood pressure 119/72, pulse 67, temperature 97.8 F (36.6 C), temperature source Oral, resp. rate 17, height 5\' 5"  (1.651 m), weight 160 lb (72.6 kg), last menstrual period 06/23/2016, SpO2 98 %, currently breastfeeding.  Physical Exam:  General: alert, cooperative and no distress Lochia:normal flow Chest: no respiratory distress Heart:regular rate, distal pulses intact Abdomen: soft, nontender,  DRESSING CHECK: pressure dressing was clean and dry, no visible bleeding Uterine Fundus: firm, appropriately tender DVT Evaluation: No calf swelling or tenderness Extremities: no edema  Recent Labs    05/04/17 0955 05/06/17 0546  HGB 11.0* 7.6*  HCT 33.0* 22.4*    Assessment/Plan:  ASSESSMENT: Audrey Dougherty is a 31 y.o. [id number] [redacted]w[redacted]d s/p rLTCS  Will stay inpatient for observation, will remove pressure dressing and evaluate w/ honeycomb only   LOS: 1 day   Lannie Heaps BlandDO 05/06/2017, 9:02 AM

## 2017-05-07 ENCOUNTER — Encounter (HOSPITAL_COMMUNITY): Payer: Self-pay | Admitting: *Deleted

## 2017-05-07 LAB — CBC
HCT: 21.5 % — ABNORMAL LOW (ref 36.0–46.0)
Hemoglobin: 7.2 g/dL — ABNORMAL LOW (ref 12.0–15.0)
MCH: 27.5 pg (ref 26.0–34.0)
MCHC: 33.5 g/dL (ref 30.0–36.0)
MCV: 82.1 fL (ref 78.0–100.0)
PLATELETS: 143 10*3/uL — AB (ref 150–400)
RBC: 2.62 MIL/uL — AB (ref 3.87–5.11)
RDW: 14.5 % (ref 11.5–15.5)
WBC: 11.2 10*3/uL — ABNORMAL HIGH (ref 4.0–10.5)

## 2017-05-07 MED ORDER — OXYCODONE HCL 5 MG PO TABS: 5.0000 mg | ORAL_TABLET | ORAL | 0 refills | Status: DC | PRN

## 2017-05-07 MED ORDER — FERROUS SULFATE 325 (65 FE) MG PO TABS: 325.0000 mg | ORAL_TABLET | Freq: Two times a day (BID) | ORAL | 0 refills | Status: AC

## 2017-05-07 NOTE — Discharge Summary (Signed)
OB Discharge Summary     Patient Name: Audrey Dougherty DOB: Aug 14, 1985 MRN: [id number]  Date of admission: 05/05/2017 Delivering MD: Laurey Arrow [place]   Date of discharge: 05/07/2017  Admitting diagnosis: RCS Intrauterine pregnancy: [redacted]w[redacted]d     Secondary diagnosis:  Active Problems:   Status post repeat low transverse cesarean section  Additional problems: None      Discharge diagnosis: Term Pregnancy Delivered                                                                                                Post partum procedures:None   Augmentation: None   Complications: None  Hospital course:  Sceduled C/S   31 y.o. yo [id number] at [redacted]w[redacted]d was admitted to the hospital 05/05/2017 for scheduled cesarean section with the following indication:Elective Repeat.  Membrane Rupture Time/Date: 10:20 AM ,05/05/2017   Patient delivered a Viable infant.05/05/2017  Details of operation can be found in separate operative note.  Pateint had an uncomplicated postpartum course.  She is ambulating, tolerating a regular diet, passing flatus, and urinating well. Patient is discharged home in stable condition on  05/07/17         Physical exam  Vitals:   05/06/17 0628 05/06/17 0827 05/06/17 1857 05/07/17 0620  BP:  119/72 115/77 107/83  Pulse:  67 82 81  Resp:  17 18 18   Temp:  97.8 F (36.6 C) 98.1 F (36.7 C) 98.2 F (36.8 C)  TempSrc:  Oral Oral Oral  SpO2: 98% 98%  100%  Weight:      Height:       General: alert, cooperative and no distress Lochia: appropriate Uterine Fundus: firm Incision: Healing well with no significant drainage DVT Evaluation: No evidence of DVT seen on physical exam. Labs: Lab Results  Component Value Date   WBC 11.2 (H) 05/07/2017   HGB 7.2 (L) 05/07/2017   HCT 21.5 (L) 05/07/2017   MCV 82.1 05/07/2017   PLT 143 (L) 05/07/2017   CMP Latest Ref Rng & Units 08/02/2015  Glucose 65 - 99 mg/dL 98  BUN 6 - 20 mg/dL 7  Creatinine 0.44 - 1.00 mg/dL 0.84   Sodium 135 - 145 mmol/L 138  Potassium 3.5 - 5.1 mmol/L 3.7  Chloride 101 - 111 mmol/L 106  CO2 22 - 32 mmol/L 23  Calcium 8.9 - 10.3 mg/dL 9.2    Discharge instruction: per After Visit Summary and "Baby and Me Booklet".  After visit meds:  Allergies as of 05/07/2017   No Known Allergies     Medication List    TAKE these medications   ferrous sulfate 325 (65 FE) MG tablet Take 1 tablet (325 mg total) by mouth 2 (two) times daily with a meal.   oxyCODONE 5 MG immediate release tablet Commonly known as:  Oxy IR/ROXICODONE Take 1 tablet (5 mg total) by mouth every 4 (four) hours as needed (pain scale 4-7).   prenatal multivitamin Tabs tablet Take 1 tablet by mouth daily at 12 noon.       Diet: routine diet  Activity: Advance as tolerated. Pelvic rest  for 6 weeks.   Outpatient follow up:4 weeks Follow up Appt:No future appointments. Follow up Visit:No Follow-up on file.  Postpartum contraception: Depo Provera  Newborn Data: Live born female  Birth Weight: 6 lb 0.3 oz (2730 g) APGAR: 8, 9  Newborn Delivery   Birth date/time:  05/05/2017 10:21:00 Delivery type:  C-Section, Low Transverse C-section categorization:  Repeat     Baby Feeding: Bottle Disposition:home with mother   05/07/2017 Marjie Skiff, MD

## 2017-05-07 NOTE — Discharge Instructions (Signed)

## 2017-05-08 LAB — TYPE AND SCREEN
ABO/RH(D): O POS
ANTIBODY SCREEN: POSITIVE
DONOR AG TYPE: NEGATIVE
Donor AG Type: NEGATIVE
PT AG TYPE: NEGATIVE
UNIT DIVISION: 0
UNIT DIVISION: 0

## 2017-05-08 LAB — BPAM RBC
BLOOD PRODUCT EXPIRATION DATE: 201812172359
BLOOD PRODUCT EXPIRATION DATE: 201812182359
UNIT TYPE AND RH: 5100
UNIT TYPE AND RH: 5100

## 2018-10-21 ENCOUNTER — Other Ambulatory Visit: Payer: Self-pay

## 2018-10-21 ENCOUNTER — Emergency Department (HOSPITAL_COMMUNITY): Payer: Medicaid Other

## 2018-10-21 ENCOUNTER — Observation Stay (HOSPITAL_COMMUNITY)
Admission: EM | Admit: 2018-10-21 | Discharge: 2018-10-22 | Disposition: A | Discharge status: Home / Self Care | Payer: Medicaid Other | Attending: Obstetrics and Gynecology | Admitting: Obstetrics and Gynecology

## 2018-10-21 DIAGNOSIS — Z1159 Encounter for screening for other viral diseases: Secondary | ICD-10-CM | POA: Insufficient documentation

## 2018-10-21 DIAGNOSIS — O034 Incomplete spontaneous abortion without complication: Secondary | ICD-10-CM

## 2018-10-21 DIAGNOSIS — O071 Delayed or excessive hemorrhage following failed attempted termination of pregnancy: Principal | ICD-10-CM | POA: Insufficient documentation

## 2018-10-21 DIAGNOSIS — N939 Abnormal uterine and vaginal bleeding, unspecified: Secondary | ICD-10-CM

## 2018-10-21 DIAGNOSIS — D62 Acute posthemorrhagic anemia: Secondary | ICD-10-CM

## 2018-10-21 DIAGNOSIS — Z79899 Other long term (current) drug therapy: Secondary | ICD-10-CM | POA: Diagnosis not present

## 2018-10-21 DIAGNOSIS — O0330 Unspecified complication following incomplete spontaneous abortion: Secondary | ICD-10-CM

## 2018-10-21 DIAGNOSIS — Z87891 Personal history of nicotine dependence: Secondary | ICD-10-CM | POA: Diagnosis not present

## 2018-10-21 LAB — COMPREHENSIVE METABOLIC PANEL
ALT: 10 U/L (ref 0–44)
AST: 14 U/L — ABNORMAL LOW (ref 15–41)
Albumin: 2.8 g/dL — ABNORMAL LOW (ref 3.5–5.0)
Alkaline Phosphatase: 46 U/L (ref 38–126)
Anion gap: 11 (ref 5–15)
BUN: 5 mg/dL — ABNORMAL LOW (ref 6–20)
CO2: 23 mmol/L (ref 22–32)
Calcium: 8.4 mg/dL — ABNORMAL LOW (ref 8.9–10.3)
Chloride: 97 mmol/L — ABNORMAL LOW (ref 98–111)
Creatinine, Ser: 0.85 mg/dL (ref 0.44–1.00)
GFR calc Af Amer: >60 mL/min (ref >60)
GFR calc non Af Amer: >60 mL/min (ref >60)
Glucose, Bld: 137 mg/dL — ABNORMAL HIGH (ref 70–99)
Potassium: 3.4 mmol/L — ABNORMAL LOW (ref 3.5–5.1)
Sodium: 131 mmol/L — ABNORMAL LOW (ref 135–145)
Total Bilirubin: 0.5 mg/dL (ref 0.3–1.2)
Total Protein: 6 g/dL — ABNORMAL LOW (ref 6.5–8.1)

## 2018-10-21 LAB — CBC
HCT: 13.1 % — ABNORMAL LOW (ref 36.0–46.0)
Hemoglobin: 3.9 g/dL — CL (ref 12.0–15.0)
MCH: 21.4 pg — ABNORMAL LOW (ref 26.0–34.0)
MCHC: 29.8 g/dL — ABNORMAL LOW (ref 30.0–36.0)
MCV: 72 fL — ABNORMAL LOW (ref 80.0–100.0)
Platelets: 470 10*3/uL — ABNORMAL HIGH (ref 150–400)
RBC: 1.82 MIL/uL — ABNORMAL LOW (ref 3.87–5.11)
RDW: 18.2 % — ABNORMAL HIGH (ref 11.5–15.5)
WBC: 18.2 10*3/uL — ABNORMAL HIGH (ref 4.0–10.5)
nRBC: 1.1 % — ABNORMAL HIGH (ref 0.0–0.2)

## 2018-10-21 LAB — I-STAT BETA HCG BLOOD, ED (MC, WL, AP ONLY): I-stat hCG, quantitative: >2000 m[IU]/mL — ABNORMAL HIGH (ref <5)

## 2018-10-21 LAB — SARS CORONAVIRUS 2 BY RT PCR (HOSPITAL ORDER, PERFORMED IN ~~LOC~~ HOSPITAL LAB): SARS Coronavirus 2: NEGATIVE

## 2018-10-21 LAB — PREPARE RBC (CROSSMATCH)

## 2018-10-21 MED ORDER — SODIUM CHLORIDE 0.9 % IV SOLN
10.0000 mL/h | Freq: Once | INTRAVENOUS | Status: AC
  Administered 2018-10-21: 10 mL/h via INTRAVENOUS

## 2018-10-21 MED ORDER — SODIUM CHLORIDE 0.9 % IV SOLN
INTRAVENOUS | Status: DC
  Administered 2018-10-21 – 2018-10-22 (×2): via INTRAVENOUS

## 2018-10-21 MED ORDER — TRANEXAMIC ACID-NACL 1000-0.7 MG/100ML-% IV SOLN
1000.0000 mg | INTRAVENOUS | Status: AC
  Administered 2018-10-21: 1000 mg via INTRAVENOUS
  Filled 2018-10-21: qty 100

## 2018-10-21 NOTE — ED Triage Notes (Signed)
Patient reports increased vaginal bleeding s/p abortion 4/22, states now using 1-2 pads per hour. Endorsing shortness of breath and dizziness with movement and ambulation now. Resp e/u.

## 2018-10-21 NOTE — Consult Note (Signed)
Reason for Consult:bleeding, anemic to 3.9, s/p elective AB with continued bleeding since procedure in April Referring Physician: Venora Maples, MD  Audrey Dougherty is an 33 y.o. female. She is a [id number], with 3 prior cesareans, who presents to [facility] ED with 3rd episode of syncope at home, due to continued daily bleeding s/p El AB 4/22 at [facility] facility in [place]. Exam here shows continued passage of dark clots, no BRB, , a closed cervical os, and serous fluid.  HCG is . 2000.  She will receive 3 units prbc, obtain u/s and probable once transfused, go to OR for D&C.   Pertinent Gynecological History: Menses: bleeding daily since 4/22 procedure. Bleeding:  Contraception: none DES exposure: unknown Blood transfusions: to recieive here 3 units Sexually transmitted diseases: no past history Previous GYN Procedures: c/s x 3 bled heavily with last c/s  Last mammogram: Date:  Last pap:  Date:  OB History: G4, P2112   Menstrual History: Menarche age:  No LMP recorded.    Past Medical History:  Diagnosis Date  . Hx of trichomoniasis 09/05/2016  . Placenta previa   . Preterm labor     Past Surgical History:  Procedure Laterality Date  . CESAREAN SECTION    . CESAREAN SECTION N/A 05/05/2017   Procedure: REPEAT CESAREAN SECTION;  Surgeon: Gwynne Edinger, MD;  Location: [facility];  Service: Obstetrics;  Laterality: N/A;    Family History  Problem Relation Age of Onset  . Diabetes Paternal Grandmother   . Anesthesia problems Neg Hx   . Hypotension Neg Hx   . Malignant hyperthermia Neg Hx   . Pseudochol deficiency Neg Hx     Social History:  reports that she quit smoking about 20 months ago. Her smoking use included cigarettes. She smoked 0.25 packs per day. She has never used smokeless tobacco. She reports that she does not drink alcohol or use drugs.  Allergies: No Known Allergies  Medications: I have reviewed the patient's current medications.  Review of  Systems  Constitutional: Negative for chills, fever and weight loss.  Eyes: Negative.   Skin: Negative.   Neurological: Positive for dizziness.    Blood pressure (!) 91/58, pulse (!) 108, temperature 98.9 F (37.2 C), resp. rate (!) 22, height 5\' 5"  (1.651 m), weight 68 kg, SpO2 100 %, unknown if currently breastfeeding. Physical Exam  Constitutional: She appears well-developed and well-nourished.  HENT:  Head: Normocephalic.  Eyes: Pupils are equal, round, and reactive to light.  Neck: Neck supple.  Cardiovascular: Regular rhythm.  BP (!) 91/58   Pulse (!) 108   Temp 98.9 F (37.2 C)   Resp (!) 22   Ht 5\' 5"  (1.651 m)   Wt 68 kg   SpO2 100%   BMI 24.96 kg/m    Respiratory: Effort normal.  GI: Soft. She exhibits no distension. There is no abdominal tenderness. There is no rebound and no guarding.  Genitourinary:    Vagina normal.     Genitourinary Comments: Large 150 cc dark clot and serous fluid in vagina.  Cervix closed, does not permit ring forceps.     Results for orders placed or performed during the hospital encounter of 10/21/18 (from the past 48 hour(s))  Comprehensive metabolic panel     Status: Abnormal   Collection Time: 10/21/18  6:55 PM  Result Value Ref Range   Sodium 131 (L) 135 - 145 mmol/L   Potassium 3.4 (L) 3.5 - 5.1 mmol/L   Chloride 97 (L)  98 - 111 mmol/L   CO2 23 22 - 32 mmol/L   Glucose, Bld 137 (H) 70 - 99 mg/dL   BUN 5 (L) 6 - 20 mg/dL   Creatinine, Ser 0.85 0.44 - 1.00 mg/dL   Calcium 8.4 (L) 8.9 - 10.3 mg/dL   Total Protein 6.0 (L) 6.5 - 8.1 g/dL   Albumin 2.8 (L) 3.5 - 5.0 g/dL   AST 14 (L) 15 - 41 U/L   ALT 10 0 - 44 U/L   Alkaline Phosphatase 46 38 - 126 U/L   Total Bilirubin 0.5 0.3 - 1.2 mg/dL   GFR calc non Af Amer >60 >60 mL/min   GFR calc Af Amer >60 >60 mL/min   Anion gap 11 5 - 15    Comment: Performed at [facility] [address] [place] [address]  CBC     Status: Abnormal   Collection Time: 10/21/18   6:55 PM  Result Value Ref Range   WBC 18.2 (H) 4.0 - 10.5 K/uL   RBC 1.82 (L) 3.87 - 5.11 MIL/uL   Hemoglobin 3.9 (LL) 12.0 - 15.0 g/dL    Comment: REPEATED TO VERIFY Reticulocyte Hemoglobin testing may be clinically indicated, consider ordering this additional test [id number] THIS CRITICAL RESULT HAS VERIFIED AND BEEN CALLED TO KELLY GIBSON,RN BY ZELDA BEECH ON 05 15 2020 AT 1921, AND HAS BEEN READ BACK.     HCT 13.1 (L) 36.0 - 46.0 %   MCV 72.0 (L) 80.0 - 100.0 fL   MCH 21.4 (L) 26.0 - 34.0 pg   MCHC 29.8 (L) 30.0 - 36.0 g/dL   RDW 18.2 (H) 11.5 - 15.5 %   Platelets 470 (H) 150 - 400 K/uL   nRBC 1.1 (H) 0.0 - 0.2 %    Comment: Performed at [facility] [address], [place], [place] [address]  Type and screen [place]     Status: None   Collection Time: 10/21/18  6:58 PM  Result Value Ref Range   ABO/RH(D) O POS    Antibody Screen POS    Sample Expiration 10/24/2018,2359    Antibody Identification      ANTI E Performed at [facility], [place] [address] [place], [place] [address]   I-Stat beta hCG blood, ED     Status: Abnormal   Collection Time: 10/21/18  8:04 PM  Result Value Ref Range   I-stat hCG, quantitative >2,000.0 (H) <5 mIU/mL   Comment 3            Comment:   GEST. AGE      CONC.  (mIU/mL)   <=1 WEEK        5 - 50     2 WEEKS       50 - 500     3 WEEKS       100 - 10,000     4 WEEKS     1,000 - 30,000        FEMALE AND NON-PREGNANT FEMALE:     LESS THAN 5 mIU/mL   Prepare RBC     Status: None   Collection Time: 10/21/18  8:25 PM  Result Value Ref Range   Order Confirmation      ORDER PROCESSED BY BLOOD BANK Performed at [facility], [place] [address] [place], [place] [address]   SARS Coronavirus 2 (CEPHEID - Performed in [facility] hospital lab), [facility]  Status: None   Collection Time: 10/21/18  8:27 PM  Result Value Ref Range   SARS Coronavirus 2 NEGATIVE NEGATIVE    Comment: (NOTE) If result is  NEGATIVE SARS-CoV-2 target nucleic acids are NOT DETECTED. The SARS-CoV-2 RNA is generally detectable in upper and lower  respiratory specimens during the acute phase of infection. The lowest  concentration of SARS-CoV-2 viral copies this assay can detect is 250  copies / mL. A negative result does not preclude SARS-CoV-2 infection  and should not be used as the sole basis for treatment or other  patient management decisions.  A negative result may occur with  improper specimen collection / handling, submission of specimen other  than nasopharyngeal swab, presence of viral mutation(s) within the  areas targeted by this assay, and inadequate number of viral copies  (<250 copies / mL). A negative result must be combined with clinical  observations, patient history, and epidemiological information. If result is POSITIVE SARS-CoV-2 target nucleic acids are DETECTED. The SARS-CoV-2 RNA is generally detectable in upper and lower  respiratory specimens dur ing the acute phase of infection.  Positive  results are indicative of active infection with SARS-CoV-2.  Clinical  correlation with patient history and other diagnostic information is  necessary to determine patient infection status.  Positive results do  not rule out bacterial infection or co-infection with other viruses. If result is PRESUMPTIVE POSTIVE SARS-CoV-2 nucleic acids MAY BE PRESENT.   A presumptive positive result was obtained on the submitted specimen  and confirmed on repeat testing.  While 2019 novel coronavirus  (SARS-CoV-2) nucleic acids may be present in the submitted sample  additional confirmatory testing may be necessary for epidemiological  and / or clinical management purposes  to differentiate between  SARS-CoV-2 and other Sarbecovirus currently known to infect humans.  If clinically indicated additional testing with an alternate test  methodology [phone]) is advised. The SARS-CoV-2 RNA is generally  detectable  in upper and lower respiratory sp ecimens during the acute  phase of infection. The expected result is Negative. Fact Sheet for Patients:  StrictlyIdeas.no Fact Sheet for Healthcare Providers: BankingDealers.co.za This test is not yet approved or cleared by the [place] FDA and has been authorized for detection and/or diagnosis of SARS-CoV-2 by FDA under an Emergency Use Authorization (EUA).  This EUA will remain in effect (meaning this test can be used) for the duration of the COVID-19 declaration under Section 564(b)(1) of the Act, 21 U.S.C. section 360bbb-3(b)(1), unless the authorization is terminated or revoked sooner. Performed at [facility], [place] [address] [place], [place] [address]     No results found.  Assessment/Plan: Probable incomplete AB Symptomatic anemia.  plan NPO Transfuse 3 units prbc TV u/s Probable OR tonight or in AM for suction D& C   Jonnie Kind 10/21/2018

## 2018-10-21 NOTE — ED Provider Notes (Addendum)
[facility] EMERGENCY DEPARTMENT Provider Note   CSN: [id number] Arrival date & time: 10/21/18  1823    History   Chief Complaint Chief Complaint  Patient presents with  . Vaginal Bleeding    HPI Audrey Dougherty is a 33 y.o. female.  Who had an abortion via Cytotec on 4-22.  Since then has had daily heavy bleeding acutely worsened yesterday.  Since yesterday states she has been going through 1-2 pads per hour.  Is now feeling lightheaded with standing and shortness of breath with walking.      HPI  Past Medical History:  Diagnosis Date  . Hx of trichomoniasis 09/05/2016  . Placenta previa   . Preterm labor     Patient Active Problem List   Diagnosis Date Noted  . Incomplete abortion with complication 79/89/2119  . Status post repeat low transverse cesarean section 05/05/2017    Past Surgical History:  Procedure Laterality Date  . CESAREAN SECTION    . CESAREAN SECTION N/A 05/05/2017   Procedure: REPEAT CESAREAN SECTION;  Surgeon: Gwynne Edinger, MD;  Location: [facility];  Service: Obstetrics;  Laterality: N/A;     OB History    Gravida  3   Para  2   Term  1   Preterm  1   AB      Living  2     SAB      TAB      Ectopic      Multiple      Live Births  2            Home Medications    Prior to Admission medications   Medication Sig Start Date End Date Taking? Authorizing Provider  acetaminophen (TYLENOL) 500 MG tablet Take 1,000 mg by mouth every 6 (six) hours as needed for headache (pain).   Yes [provider]  ferrous sulfate 325 (65 FE) MG tablet Take 1 tablet (325 mg total) by mouth 2 (two) times daily with a meal. 05/07/17  Yes Diallo, Abdoulaye, MD  ibuprofen (ADVIL) 200 MG tablet Take 600 mg by mouth every 6 (six) hours as needed for headache (pain).   Yes [provider]  Multiple Vitamin (MULTIVITAMIN WITH MINERALS) TABS tablet Take 1 tablet by mouth daily.   Yes [provider]    Family History Family History  Problem Relation Age of Onset  . Diabetes Paternal Grandmother   . Anesthesia problems Neg Hx   . Hypotension Neg Hx   . Malignant hyperthermia Neg Hx   . Pseudochol deficiency Neg Hx     Social History Social History   Tobacco Use  . Smoking status: Former Smoker    Packs/day: 0.25    Types: Cigarettes    Last attempt to quit: 01/26/2017    Years since quitting: 1.7  . Smokeless tobacco: Never Used  Substance Use Topics  . Alcohol use: No  . Drug use: No     Allergies   Patient has no known allergies.   Review of Systems Review of Systems  Constitutional: Positive for activity change. Negative for chills and fever.  HENT: Negative for ear pain and sore throat.   Eyes: Negative for pain and visual disturbance.  Respiratory: Negative for cough and shortness of breath.   Cardiovascular: Negative for chest pain and palpitations.  Gastrointestinal: Negative for abdominal pain, nausea and vomiting.  Genitourinary: Positive for vaginal bleeding. Negative for difficulty urinating, dysuria, hematuria and pelvic pain.  Musculoskeletal: Negative for arthralgias and back pain.  Skin: Negative for color change and rash.  Neurological: Positive for light-headedness. Negative for seizures and syncope.  All other systems reviewed and are negative.    Physical Exam Updated Vital Signs BP 94/62 (BP Location: Right Arm)   Pulse 86   Temp 98.3 F (36.8 C) (Oral)   Resp 18   Ht 5\' 5"  (1.651 m)   Wt 68 kg   SpO2 100%   BMI 24.96 kg/m   Physical Exam Vitals signs and nursing note reviewed.  Constitutional:      General: She is not in acute distress.    Appearance: She is well-developed.  HENT:     Head: Normocephalic and atraumatic.  Eyes:     Conjunctiva/sclera: Conjunctivae normal.  Neck:     Musculoskeletal: Neck supple.  Cardiovascular:     Rate and Rhythm: Normal rate and regular rhythm.     Heart sounds: No  murmur.  Pulmonary:     Effort: Pulmonary effort is normal. No respiratory distress.     Breath sounds: Normal breath sounds.  Abdominal:     Palpations: Abdomen is soft.     Tenderness: There is no abdominal tenderness.  Skin:    General: Skin is warm and dry.  Neurological:     Mental Status: She is alert.      ED Treatments / Results  Labs (all labs ordered are listed, but only abnormal results are displayed) Labs Reviewed  SURGICAL PCR SCREEN - Abnormal; Notable for the following components:      Result Value   Staphylococcus aureus POSITIVE (*)    All other components within normal limits  COMPREHENSIVE METABOLIC PANEL - Abnormal; Notable for the following components:   Sodium 131 (*)    Potassium 3.4 (*)    Chloride 97 (*)    Glucose, Bld 137 (*)    BUN 5 (*)    Calcium 8.4 (*)    Total Protein 6.0 (*)    Albumin 2.8 (*)    AST 14 (*)    All other components within normal limits  CBC - Abnormal; Notable for the following components:   WBC 18.2 (*)    RBC 1.82 (*)    Hemoglobin 3.9 (*)    HCT 13.1 (*)    MCV 72.0 (*)    MCH 21.4 (*)    MCHC 29.8 (*)    RDW 18.2 (*)    Platelets 470 (*)    nRBC 1.1 (*)    All other components within normal limits  HCG, QUANTITATIVE, PREGNANCY - Abnormal; Notable for the following components:   hCG, Beta Chain, Quant, S 13,955 (*)    All other components within normal limits  CBC - Abnormal; Notable for the following components:   WBC 15.3 (*)    RBC 2.90 (*)    Hemoglobin 7.8 (*)    HCT 23.1 (*)    MCV 79.7 (*)    RDW 19.8 (*)    nRBC 0.8 (*)    All other components within normal limits  I-STAT BETA HCG BLOOD, ED ([facility], [facility], [facility] ONLY) - Abnormal; Notable for the following components:   I-stat hCG, quantitative >2,000.0 (*)    All other components within normal limits  SARS CORONAVIRUS 2 (HOSPITAL ORDER, [facility])  APTT  PROTIME-INR  TYPE AND SCREEN  PREPARE RBC (CROSSMATCH)    EKG None   Radiology US Pelvic Complete With Transvaginal  Result  Date: 10/21/2018 CLINICAL DATA:  Post D and C April 22nd, 3 weeks prior, vaginal bleeding. EXAM: TRANSABDOMINAL AND TRANSVAGINAL ULTRASOUND OF PELVIS TECHNIQUE: Both transabdominal and transvaginal ultrasound examinations of the pelvis were performed. Transabdominal technique was performed for global imaging of the pelvis including uterus, ovaries, adnexal regions, and pelvic cul-de-sac. It was necessary to proceed with endovaginal exam following the transabdominal exam to visualize the uterus and endometrium. COMPARISON:  None FINDINGS: Uterus Measurements: 10 x 5.6 x 6.1 cm = volume: 175 mL. Within the lower uterine segment/cervix is a 12.5 x 5.6 x 10.2 mm a vascular cystic structure with central hyperechoic focus. Endometrium Thickness: 11 mm distally, heterogeneous but without internal flow. There is a large heterogeneous avascular structure expanding and extending from the lower endometrium through the vaginal canal measuring 6.6 x 2.8 x 5.2 cm most consistent with clot. There is fluid in the vaginal canal. Right ovary No visualized, no adnexal mass. Left ovary Not visualized, no adnexal mass. Other findings No abnormal free fluid. IMPRESSION: Sonographic findings compatible with retained products of conception in the appropriate clinical setting. Large clot extending from the lower endometrial canal into the vagina. Heterogeneous endometrial thickening in the fundus at 11 mm, but without significant internal vascularity. Electronically Signed   By: Keith Rake M.D.   On: 10/21/2018 23:31    Procedures .Critical Care Performed by: Doneta Public, MD Authorized by: Jola Schmidt, MD   Critical care provider statement:    Critical care time (minutes):  30   Critical care time was exclusive of:  Separately billable procedures and treating other patients   Critical care was necessary to treat or prevent imminent or life-threatening  deterioration of the following conditions:  Shock and circulatory failure   Critical care was time spent personally by me on the following activities:  Development of treatment plan with patient or surrogate, discussions with consultants, evaluation of patient's response to treatment, examination of patient, obtaining history from patient or surrogate, ordering and performing treatments and interventions, ordering and review of laboratory studies and re-evaluation of patient's condition   (including critical care time)  Medications Ordered in ED Medications  0.9 %  sodium chloride infusion ( Intravenous New Bag/Given 10/22/18 0754)  prenatal multivitamin tablet 1 tablet (has no administration in time range)  ondansetron (ZOFRAN) tablet 4 mg (has no administration in time range)    Or  ondansetron (ZOFRAN) injection 4 mg (has no administration in time range)  0.9 %  sodium chloride infusion (0 mL/hr Intravenous Stopped 10/22/18 0115)  tranexamic acid (CYKLOKAPRON) IVPB 1,000 mg (0 mg Intravenous Stopped 10/22/18 0007)  ondansetron (ZOFRAN) injection 4 mg (4 mg Intravenous Given 10/22/18 0013)     Initial Impression / Assessment and Plan / ED Course  I have reviewed the triage vital signs and the nursing notes.  Pertinent labs & imaging results that were available during my care of the patient were reviewed by me and considered in my medical decision making (see chart for details).        Audrey Dougherty is a 33 y.o. female.  Who had an abortion via Cytotec on 4-22.  Since then has had daily heavy bleeding acutely worsened yesterday.  Since yesterday states she has been going through 1-2 pads per hour.   On initial exam she feels irritable but nonacute distress.  She is tachycardic to 108 but normotensive.  Lab work showed a hemoglobin of 3.9 baseline around 7.9.   Patient typed and screened and 4  units of blood ordered as well as gynecology consulted.   Patient transfused 3U of  blood in the ED. Patient will be admitted to gynecology to undergo D&C.  Final Clinical Impressions(s) / ED Diagnoses   Final diagnoses:  Vaginal bleeding  Acute post-hemorrhagic anemia  Retained products of conception following abortion    ED Discharge Orders    None       Doneta Public, MD 10/22/18 1124    Doneta Public, MD 10/22/18 [place], MD 10/22/18 [phone]

## 2018-10-21 NOTE — ED Notes (Signed)
Taken to US at this time. 

## 2018-10-22 ENCOUNTER — Encounter (HOSPITAL_COMMUNITY): Payer: Self-pay | Admitting: Certified Registered"

## 2018-10-22 ENCOUNTER — Observation Stay (HOSPITAL_COMMUNITY): Payer: Medicaid Other | Admitting: Anesthesiology

## 2018-10-22 ENCOUNTER — Encounter (HOSPITAL_COMMUNITY)
Admission: EM | Disposition: A | Discharge status: Home / Self Care | Payer: Self-pay | Source: Home / Self Care | Attending: Emergency Medicine

## 2018-10-22 DIAGNOSIS — O0289 Other abnormal products of conception: Secondary | ICD-10-CM

## 2018-10-22 DIAGNOSIS — Z79899 Other long term (current) drug therapy: Secondary | ICD-10-CM | POA: Diagnosis not present

## 2018-10-22 DIAGNOSIS — Z1159 Encounter for screening for other viral diseases: Secondary | ICD-10-CM | POA: Diagnosis not present

## 2018-10-22 DIAGNOSIS — D62 Acute posthemorrhagic anemia: Secondary | ICD-10-CM | POA: Diagnosis not present

## 2018-10-22 DIAGNOSIS — O0330 Unspecified complication following incomplete spontaneous abortion: Secondary | ICD-10-CM | POA: Diagnosis present

## 2018-10-22 DIAGNOSIS — O071 Delayed or excessive hemorrhage following failed attempted termination of pregnancy: Secondary | ICD-10-CM | POA: Diagnosis not present

## 2018-10-22 HISTORY — PX: DILATION AND EVACUATION: SHX1459

## 2018-10-22 LAB — PROTIME-INR
INR: 1.1 (ref 0.8–1.2)
Prothrombin Time: 14.2 seconds (ref 11.4–15.2)

## 2018-10-22 LAB — SURGICAL PCR SCREEN
MRSA, PCR: NEGATIVE
Staphylococcus aureus: POSITIVE — AB

## 2018-10-22 LAB — CBC
HCT: 23.1 % — ABNORMAL LOW (ref 36.0–46.0)
Hemoglobin: 7.8 g/dL — ABNORMAL LOW (ref 12.0–15.0)
MCH: 26.9 pg (ref 26.0–34.0)
MCHC: 33.8 g/dL (ref 30.0–36.0)
MCV: 79.7 fL — ABNORMAL LOW (ref 80.0–100.0)
Platelets: 327 10*3/uL (ref 150–400)
RBC: 2.9 MIL/uL — ABNORMAL LOW (ref 3.87–5.11)
RDW: 19.8 % — ABNORMAL HIGH (ref 11.5–15.5)
WBC: 15.3 10*3/uL — ABNORMAL HIGH (ref 4.0–10.5)
nRBC: 0.8 % — ABNORMAL HIGH (ref 0.0–0.2)

## 2018-10-22 LAB — HCG, QUANTITATIVE, PREGNANCY: hCG, Beta Chain, Quant, S: 13955 m[IU]/mL — ABNORMAL HIGH (ref <5)

## 2018-10-22 LAB — APTT: aPTT: 33 seconds (ref 24–36)

## 2018-10-22 SURGERY — DILATION AND EVACUATION, UTERUS
Anesthesia: General

## 2018-10-22 MED ORDER — MISOPROSTOL 200 MCG PO TABS
ORAL_TABLET | ORAL | Status: DC | PRN
  Administered 2018-10-22: 800 ug via RECTAL

## 2018-10-22 MED ORDER — ONDANSETRON HCL 4 MG/2ML IJ SOLN: 4.0000 mg | Freq: Four times a day (QID) | INTRAMUSCULAR | Status: DC | PRN

## 2018-10-22 MED ORDER — PROPOFOL 10 MG/ML IV BOLUS
INTRAVENOUS | Status: DC | PRN
  Administered 2018-10-22: 140 mg via INTRAVENOUS

## 2018-10-22 MED ORDER — LIDOCAINE 2% (20 MG/ML) 5 ML SYRINGE
INTRAMUSCULAR | Status: AC
  Filled 2018-10-22: qty 5

## 2018-10-22 MED ORDER — ONDANSETRON HCL 4 MG PO TABS: 4.0000 mg | ORAL_TABLET | Freq: Four times a day (QID) | ORAL | Status: DC | PRN

## 2018-10-22 MED ORDER — MUPIROCIN 2 % EX OINT: 1.0000 "application " | TOPICAL_OINTMENT | Freq: Two times a day (BID) | CUTANEOUS | Status: DC

## 2018-10-22 MED ORDER — BUPIVACAINE HCL 0.25 % IJ SOLN
INTRAMUSCULAR | Status: DC | PRN
  Administered 2018-10-22: 10 mL

## 2018-10-22 MED ORDER — MIDAZOLAM HCL 5 MG/5ML IJ SOLN
INTRAMUSCULAR | Status: DC | PRN
  Administered 2018-10-22: 2 mg via INTRAVENOUS

## 2018-10-22 MED ORDER — DOXYCYCLINE HYCLATE 100 MG IV SOLR
200.0000 mg | INTRAVENOUS | Status: AC
  Administered 2018-10-22: 15:00:00 200 mg via INTRAVENOUS
  Filled 2018-10-22: qty 200

## 2018-10-22 MED ORDER — FENTANYL CITRATE (PF) 100 MCG/2ML IJ SOLN
INTRAMUSCULAR | Status: DC | PRN
  Administered 2018-10-22 (×2): 25 ug via INTRAVENOUS

## 2018-10-22 MED ORDER — OXYCODONE HCL 5 MG PO TABS: 5.0000 mg | ORAL_TABLET | Freq: Four times a day (QID) | ORAL | 0 refills | Status: DC | PRN

## 2018-10-22 MED ORDER — ONDANSETRON HCL 4 MG/2ML IJ SOLN
INTRAMUSCULAR | Status: AC
  Filled 2018-10-22: qty 2

## 2018-10-22 MED ORDER — MISOPROSTOL 200 MCG PO TABS
ORAL_TABLET | ORAL | Status: AC
  Filled 2018-10-22: qty 4

## 2018-10-22 MED ORDER — BUPIVACAINE HCL (PF) 0.25 % IJ SOLN
INTRAMUSCULAR | Status: AC
  Filled 2018-10-22: qty 30

## 2018-10-22 MED ORDER — DEXAMETHASONE SODIUM PHOSPHATE 10 MG/ML IJ SOLN
INTRAMUSCULAR | Status: DC | PRN
  Administered 2018-10-22: 10 mg via INTRAVENOUS

## 2018-10-22 MED ORDER — CHLORHEXIDINE GLUCONATE CLOTH 2 % EX PADS
6.0000 | MEDICATED_PAD | Freq: Every day | CUTANEOUS | Status: DC
  Administered 2018-10-22: 14:00:00 6 via TOPICAL

## 2018-10-22 MED ORDER — PRENATAL MULTIVITAMIN CH
1.0000 | ORAL_TABLET | Freq: Every day | ORAL | Status: DC
  Filled 2018-10-22: qty 1

## 2018-10-22 MED ORDER — ONDANSETRON HCL 4 MG/2ML IJ SOLN
4.0000 mg | Freq: Once | INTRAMUSCULAR | Status: AC
  Administered 2018-10-22: 4 mg via INTRAVENOUS
  Filled 2018-10-22: qty 2

## 2018-10-22 MED ORDER — LIDOCAINE 2% (20 MG/ML) 5 ML SYRINGE
INTRAMUSCULAR | Status: DC | PRN
  Administered 2018-10-22: 50 mg via INTRAVENOUS

## 2018-10-22 MED ORDER — 0.9 % SODIUM CHLORIDE (POUR BTL) OPTIME
TOPICAL | Status: DC | PRN
  Administered 2018-10-22: 15:00:00 1000 mL

## 2018-10-22 MED ORDER — MEDROXYPROGESTERONE ACETATE 150 MG/ML IM SUSP: 150.0000 mg | Freq: Once | INTRAMUSCULAR | Status: AC

## 2018-10-22 MED ORDER — LACTATED RINGERS IV SOLN
INTRAVENOUS | Status: DC
  Administered 2018-10-22: 15:00:00 via INTRAVENOUS

## 2018-10-22 MED ORDER — IBUPROFEN 800 MG PO TABS: 800.0000 mg | ORAL_TABLET | Freq: Three times a day (TID) | ORAL | 0 refills | Status: DC | PRN

## 2018-10-22 MED ORDER — KETOROLAC TROMETHAMINE 30 MG/ML IJ SOLN
INTRAMUSCULAR | Status: DC | PRN
  Administered 2018-10-22: 30 mg via INTRAVENOUS

## 2018-10-22 MED ORDER — ONDANSETRON HCL 4 MG/2ML IJ SOLN
INTRAMUSCULAR | Status: DC | PRN
  Administered 2018-10-22: 4 mg via INTRAVENOUS

## 2018-10-22 MED ORDER — MIDAZOLAM HCL 2 MG/2ML IJ SOLN
INTRAMUSCULAR | Status: AC
  Filled 2018-10-22: qty 2

## 2018-10-22 MED ORDER — DEXAMETHASONE SODIUM PHOSPHATE 10 MG/ML IJ SOLN
INTRAMUSCULAR | Status: AC
  Filled 2018-10-22: qty 1

## 2018-10-22 MED ORDER — FENTANYL CITRATE (PF) 250 MCG/5ML IJ SOLN
INTRAMUSCULAR | Status: AC
  Filled 2018-10-22: qty 5

## 2018-10-22 SURGICAL SUPPLY — 18 items
CATH ROBINSON RED A/P 16FR (CATHETERS) ×3 IMPLANT
DECANTER SPIKE VIAL GLASS SM (MISCELLANEOUS) ×3 IMPLANT
GLOVE BIO SURGEON STRL SZ7.5 (GLOVE) ×3 IMPLANT
GLOVE BIOGEL PI IND STRL 7.0 (GLOVE) ×1 IMPLANT
GLOVE BIOGEL PI INDICATOR 7.0 (GLOVE) ×2
GOWN STRL REUS W/ TWL LRG LVL3 (GOWN DISPOSABLE) ×1 IMPLANT
GOWN STRL REUS W/TWL LRG LVL3 (GOWN DISPOSABLE) ×3
GOWN STRL REUS W/TWL XL LVL3 (GOWN DISPOSABLE) ×3 IMPLANT
HIBICLENS CHG 4% 4OZ BTL (MISCELLANEOUS) ×3 IMPLANT
KIT BERKELEY 1ST TRIMESTER 3/8 (MISCELLANEOUS) ×3 IMPLANT
NS IRRIG 1000ML POUR BTL (IV SOLUTION) ×3 IMPLANT
PACK VAGINAL MINOR WOMEN LF (CUSTOM PROCEDURE TRAY) ×3 IMPLANT
PAD OB MATERNITY 4.3X12.25 (PERSONAL CARE ITEMS) ×3 IMPLANT
SET BERKELEY SUCTION TUBING (SUCTIONS) ×3 IMPLANT
TOP CONDUCTIVE GREEN 3/8 (MISCELLANEOUS) ×3 IMPLANT
TOWEL GREEN STERILE FF (TOWEL DISPOSABLE) ×6 IMPLANT
UNDERPAD 30X30 (UNDERPADS AND DIAPERS) ×3 IMPLANT
VACURETTE 7MM CVD STRL WRAP (CANNULA) ×2 IMPLANT

## 2018-10-22 NOTE — H&P (Signed)
Reason for Consult:bleeding, anemic to 3.9, s/p elective AB with continued bleeding since procedure in April Referring Physician: Venora Maples, MD  Audrey Dougherty is an 33 y.o. female. She is a [id number], with 3 prior cesareans, who presents to [facility] ED with 3rd episode of syncope at home, due to continued daily bleeding s/p El AB 4/22 at [facility] facility in [place]. Exam here shows continued passage of dark clots, no BRB, , a closed cervical os, and serous fluid.  HCG is . 2000.  She will receive 3 units prbc, obtain u/s and probable once transfused, go to OR for D&C.   Pertinent Gynecological History: Menses: bleeding daily since 4/22 procedure. Bleeding:  Contraception: none DES exposure: unknown Blood transfusions: to recieive here 3 units Sexually transmitted diseases: no past history Previous GYN Procedures: c/s x 3 bled heavily with last c/s  Last mammogram: Date:  Last pap:  Date:  OB History: G4, P2112             Menstrual History: Menarche age:  No LMP recorded.      Past Medical History:  Diagnosis Date  . Hx of trichomoniasis 09/05/2016  . Placenta previa   . Preterm labor          Past Surgical History:  Procedure Laterality Date  . CESAREAN SECTION    . CESAREAN SECTION N/A 05/05/2017   Procedure: REPEAT CESAREAN SECTION;  Surgeon: Gwynne Edinger, MD;  Location: [facility];  Service: Obstetrics;  Laterality: N/A;         Family History  Problem Relation Age of Onset  . Diabetes Paternal Grandmother   . Anesthesia problems Neg Hx   . Hypotension Neg Hx   . Malignant hyperthermia Neg Hx   . Pseudochol deficiency Neg Hx     Social History:  reports that she quit smoking about 20 months ago. Her smoking use included cigarettes. She smoked 0.25 packs per day. She has never used smokeless tobacco. She reports that she does not drink alcohol or use drugs.  Allergies: No Known Allergies  Medications: I have reviewed  the patient's current medications.  Review of Systems  Constitutional: Negative for chills, fever and weight loss.  Eyes: Negative.   Skin: Negative.   Neurological: Positive for dizziness.    Blood pressure (!) 91/58, pulse (!) 108, temperature 98.9 F (37.2 C), resp. rate (!) 22, height 5\' 5"  (1.651 m), weight 68 kg, SpO2 100 %, unknown if currently breastfeeding. Physical Exam  Constitutional: She appears well-developed and well-nourished.  HENT:  Head: Normocephalic.  Eyes: Pupils are equal, round, and reactive to light.  Neck: Neck supple.  Cardiovascular: Regular rhythm.  BP (!) 91/58   Pulse (!) 108   Temp 98.9 F (37.2 C)   Resp (!) 22   Ht 5\' 5"  (1.651 m)   Wt 68 kg   SpO2 100%   BMI 24.96 kg/m    Respiratory: Effort normal.  GI: Soft. She exhibits no distension. There is no abdominal tenderness. There is no rebound and no guarding.  Genitourinary:    Vagina normal.     Genitourinary Comments: Large 150 cc dark clot and serous fluid in vagina.  Cervix closed, does not permit ring forceps.     LabResultsLast48Hours        Results for orders placed or performed during the hospital encounter of 10/21/18 (from the past 48 hour(s))  Comprehensive metabolic panel     Status: Abnormal   Collection Time: 10/21/18  6:55 PM  Result Value Ref Range   Sodium 131 (L) 135 - 145 mmol/L   Potassium 3.4 (L) 3.5 - 5.1 mmol/L   Chloride 97 (L) 98 - 111 mmol/L   CO2 23 22 - 32 mmol/L   Glucose, Bld 137 (H) 70 - 99 mg/dL   BUN 5 (L) 6 - 20 mg/dL   Creatinine, Ser 0.85 0.44 - 1.00 mg/dL   Calcium 8.4 (L) 8.9 - 10.3 mg/dL   Total Protein 6.0 (L) 6.5 - 8.1 g/dL   Albumin 2.8 (L) 3.5 - 5.0 g/dL   AST 14 (L) 15 - 41 U/L   ALT 10 0 - 44 U/L   Alkaline Phosphatase 46 38 - 126 U/L   Total Bilirubin 0.5 0.3 - 1.2 mg/dL   GFR calc non Af Amer >60 >60 mL/min   GFR calc Af Amer >60 >60 mL/min   Anion gap 11 5 - 15    Comment: Performed at [facility], [place] [address], [place] [address]  CBC     Status: Abnormal   Collection Time: 10/21/18  6:55 PM  Result Value Ref Range   WBC 18.2 (H) 4.0 - 10.5 K/uL   RBC 1.82 (L) 3.87 - 5.11 MIL/uL   Hemoglobin 3.9 (LL) 12.0 - 15.0 g/dL    Comment: REPEATED TO VERIFY Reticulocyte Hemoglobin testing may be clinically indicated, consider ordering this additional test [id number] THIS CRITICAL RESULT HAS VERIFIED AND BEEN CALLED TO KELLY GIBSON,RN BY ZELDA BEECH ON 05 15 2020 AT 1921, AND HAS BEEN READ BACK.     HCT 13.1 (L) 36.0 - 46.0 %   MCV 72.0 (L) 80.0 - 100.0 fL   MCH 21.4 (L) 26.0 - 34.0 pg   MCHC 29.8 (L) 30.0 - 36.0 g/dL   RDW 18.2 (H) 11.5 - 15.5 %   Platelets 470 (H) 150 - 400 K/uL   nRBC 1.1 (H) 0.0 - 0.2 %    Comment: Performed at [facility] [address], [place], [place] [address]  Type and screen [place]     Status: None   Collection Time: 10/21/18  6:58 PM  Result Value Ref Range   ABO/RH(D) O POS    Antibody Screen POS    Sample Expiration 10/24/2018,2359    Antibody Identification      ANTI E Performed at [facility], [place] [address], [place], [place] [address]   I-Stat beta hCG blood, ED     Status: Abnormal   Collection Time: 10/21/18  8:04 PM  Result Value Ref Range   I-stat hCG, quantitative >2,000.0 (H) <5 mIU/mL   Comment 3            Comment:   GEST. AGE      CONC.  (mIU/mL)   <=1 WEEK        5 - 50     2 WEEKS       50 - 500     3 WEEKS       100 - 10,000     4 WEEKS     1,000 - 30,000        FEMALE AND NON-PREGNANT FEMALE:     LESS THAN 5 mIU/mL   Prepare RBC     Status: None   Collection Time: 10/21/18  8:25 PM  Result Value Ref Range   Order Confirmation      ORDER PROCESSED BY BLOOD BANK Performed at  [facility], [place] [address] [place], [place] [address]   SARS Coronavirus 2 (CEPHEID - Performed in [facility] hospital lab), Hosp Order     Status:  None   Collection Time: 10/21/18  8:27 PM  Result Value Ref Range   SARS Coronavirus 2 NEGATIVE NEGATIVE    Comment: (NOTE) If result is NEGATIVE SARS-CoV-2 target nucleic acids are NOT DETECTED. The SARS-CoV-2 RNA is generally detectable in upper and lower  respiratory specimens during the acute phase of infection. The lowest  concentration of SARS-CoV-2 viral copies this assay can detect is 250  copies / mL. A negative result does not preclude SARS-CoV-2 infection  and should not be used as the sole basis for treatment or other  patient management decisions.  A negative result may occur with  improper specimen collection / handling, submission of specimen other  than nasopharyngeal swab, presence of viral mutation(s) within the  areas targeted by this assay, and inadequate number of viral copies  (<250 copies / mL). A negative result must be combined with clinical  observations, patient history, and epidemiological information. If result is POSITIVE SARS-CoV-2 target nucleic acids are DETECTED. The SARS-CoV-2 RNA is generally detectable in upper and lower  respiratory specimens dur ing the acute phase of infection.  Positive  results are indicative of active infection with SARS-CoV-2.  Clinical  correlation with patient history and other diagnostic information is  necessary to determine patient infection status.  Positive results do  not rule out bacterial infection or co-infection with other viruses. If result is PRESUMPTIVE POSTIVE SARS-CoV-2 nucleic acids MAY BE PRESENT.   A presumptive positive result was obtained on the submitted specimen  and confirmed on repeat testing.  While 2019 novel coronavirus  (SARS-CoV-2) nucleic acids may be present in the submitted sample  additional confirmatory testing may be necessary for epidemiological  and / or clinical management purposes  to differentiate between  SARS-CoV-2 and other Sarbecovirus currently known to infect humans.  If  clinically indicated additional testing with an alternate test  methodology [phone]) is advised. The SARS-CoV-2 RNA is generally  detectable in upper and lower respiratory sp ecimens during the acute  phase of infection. The expected result is Negative. Fact Sheet for Patients:  StrictlyIdeas.no Fact Sheet for Healthcare Providers: BankingDealers.co.za This test is not yet approved or cleared by the [place] FDA and has been authorized for detection and/or diagnosis of SARS-CoV-2 by FDA under an Emergency Use Authorization (EUA).  This EUA will remain in effect (meaning this test can be used) for the duration of the COVID-19 declaration under Section 564(b)(1) of the Act, 21 U.S.C. section 360bbb-3(b)(1), unless the authorization is terminated or revoked sooner. Performed at [facility], [place] [address], [place], [place] [address]       ImagingResults(Last48hours)  No results found.  CLINICAL DATA:  Post D and C April 22nd, 3 weeks prior, vaginal bleeding.  EXAM: TRANSABDOMINAL AND TRANSVAGINAL ULTRASOUND OF PELVIS  TECHNIQUE: Both transabdominal and transvaginal ultrasound examinations of the pelvis were performed. Transabdominal technique was performed for global imaging of the pelvis including uterus, ovaries, adnexal regions, and pelvic cul-de-sac. It was necessary to proceed with endovaginal exam following the transabdominal exam to visualize the uterus and endometrium.  COMPARISON:  None  FINDINGS: Uterus  Measurements: 10 x 5.6 x 6.1 cm = volume: 175 mL. Within the lower uterine segment/cervix is a 12.5 x 5.6 x 10.2 mm a vascular cystic structure with central hyperechoic focus.  Endometrium  Thickness: 11 mm distally, heterogeneous but without internal flow. There is a large heterogeneous avascular structure expanding and extending from the lower endometrium through the vaginal  canal measuring 6.6 x 2.8 x 5.2 cm most consistent with clot. There is fluid in the vaginal canal.  Right ovary  No visualized, no adnexal mass.  Left ovary  Not visualized, no adnexal mass.  Other findings  No abnormal free fluid.  IMPRESSION: Sonographic findings compatible with retained products of conception in the appropriate clinical setting. Large clot extending from the lower endometrial canal into the vagina. Heterogeneous endometrial thickening in the fundus at 11 mm, but without significant internal vascularity.   Electronically Signed   By: Keith Rake M.D.   On: 10/21/2018 23:31   Assessment/Plan: Probable incomplete AB Symptomatic anemia.  plan NPO Transfuse 3 units prbc administer TXA tonight. TV u/s done: as above Probable OR  in AM for suction D& C   Jonnie Kind 10/21/2018

## 2018-10-22 NOTE — Op Note (Signed)
Audrey Dougherty PROCEDURE DATE: 10/22/2018  PREOPERATIVE DIAGNOSIS: 10 week missed abortion and retained products of conception POSTOPERATIVE DIAGNOSIS: The same PROCEDURE:     Dilation and Evacuation SURGEON:  Dr. Arlina Robes  INDICATIONS: 33 y.o. [id number] with MAB with retained POCs at [redacted] weeks gestation, needing surgical completion.  Risks of surgery were discussed with the patient including but not limited to: bleeding which may require transfusion; infection which may require antibiotics; injury to uterus or surrounding organs; need for additional procedures including laparotomy or laparoscopy; possibility of intrauterine scarring which may impair future fertility; and other postoperative/anesthesia complications. Written informed consent was obtained.    FINDINGS:  A 10 week size uterus, moderate amounts of products of conception, specimen sent to pathology.  ANESTHESIA:    LMA , paracervical block. INTRAVENOUS FLUIDS:  As recorded ESTIMATED BLOOD LOSS:  Less than 50 ml. SPECIMENS:  Products of conception sent to pathology COMPLICATIONS:  None immediate.  PROCEDURE DETAILS:  The patient received intravenous Doxycycline while in the preoperative area.  She was then taken to the operating room where monitored intravenous sedation was administered and was found to be adequate.  After an adequate timeout was performed, she was placed in the dorsal lithotomy position and examined; then prepped and draped in the sterile manner.   Her bladder was catheterized for an unmeasured amount of clear, yellow urine. A vaginal speculum was then placed in the patient's vagina and a single tooth tenaculum was applied to the anterior lip of the cervix.  A paracervical block using 10 ml of 0.5% Marcaine was administered. The cervix was gently dilated to accommodate a 7 mm suction curette that was gently advanced to the uterine fundus.  The suction device was then activated and curette slowly rotated to  clear the uterus of products of conception.  A sharp curettage was then performed to confirm complete emptying of the uterus. There was minimal bleeding noted and the tenaculum removed with good hemostasis noted.   All instruments were removed from the patient's vagina.  Sponge and instrument counts were correct times two  The patient tolerated the procedure well and was taken to the recovery area awake, and in stable condition.  The patient will be discharged to home as per PACU criteria.  Routine postoperative instructions given.  She was prescribed Percocet and Ibuprofen   She will follow up in the clinic on 4 weeks for postoperative evaluation.   Arlina Robes, MD, [place] Attending [place], [facility]

## 2018-10-22 NOTE — Progress Notes (Signed)
Audrey Dougherty to be D/C'd  per MD order. Discussed with the patient and all questions fully answered.  VSS, Skin clean, dry and intact without evidence of skin break down, no evidence of skin tears noted.  IV catheter discontinued intact. Site without signs and symptoms of complications. Dressing and pressure applied.  An After Visit Summary was printed and given to the patient. Patient received prescription.  D/c education completed with patient/family including follow up instructions, medication list, d/c activities limitations if indicated, with other d/c instructions as indicated by MD - patient able to verbalize understanding, all questions fully answered.   Patient instructed to return to ED, call 911, or call MD for any changes in condition.   Patient to be escorted via [place], and D/C home via private auto.

## 2018-10-22 NOTE — Anesthesia Postprocedure Evaluation (Signed)
Anesthesia Post Note  Patient: Audrey Dougherty  Procedure(s) Performed: DILATATION AND EVACUATION FOR RETAINED PRODUCTS OF CONCEPTION (N/A )     Patient location during evaluation: PACU Anesthesia Type: General Level of consciousness: awake and alert Pain management: pain level controlled Vital Signs Assessment: post-procedure vital signs reviewed and stable Respiratory status: spontaneous breathing, nonlabored ventilation, respiratory function stable and patient connected to nasal cannula oxygen Cardiovascular status: blood pressure returned to baseline and stable Postop Assessment: no apparent nausea or vomiting Anesthetic complications: no    Last Vitals:  Vitals:   10/22/18 1615 10/22/18 1626  BP: 116/79 (!) 119/92  Pulse: 84 85  Resp: 15 16  Temp:  36.8 C  SpO2: 100% 100%    Last Pain:  Vitals:   10/22/18 1626  TempSrc: Oral  PainSc:                  Leonid Manus COKER

## 2018-10-22 NOTE — ED Notes (Signed)
ED TO INPATIENT HANDOFF REPORT  ED Nurse Name and Phone #: Caidance Sybert [phone]  S Name/Age/Gender Audrey Dougherty 33 y.o. female Room/Bed: 018C/018C  Code Status   Code Status: Full Code  Home/SNF/Other Home Patient oriented to: self, place, time and situation Is this baseline? Yes   Triage Complete: Triage complete  Chief Complaint vaginal bleeding/dizzy   Triage Note Patient reports increased vaginal bleeding s/p abortion 4/22, states now using 1-2 pads per hour. Endorsing shortness of breath and dizziness with movement and ambulation now. Resp e/u.    Allergies No Known Allergies  Level of Care/Admitting Diagnosis ED Disposition    ED Disposition Condition [facility] Area: [place] [[id number]]  Level of Care: Med-Surg [16]  Covid Evaluation: Screening Protocol (No Symptoms)  Diagnosis: Incomplete abortion with complication [194174]  Admitting Physician: Marjory Lies  Attending Physician: Jonnie Kind [[id number]  PT Class (Do Not Modify): Observation [104]  PT Acc Code (Do Not Modify): Observation [10022]       B Medical/Surgery History Past Medical History:  Diagnosis Date  . Hx of trichomoniasis 09/05/2016  . Placenta previa   . Preterm labor    Past Surgical History:  Procedure Laterality Date  . CESAREAN SECTION    . CESAREAN SECTION N/A 05/05/2017   Procedure: REPEAT CESAREAN SECTION;  Surgeon: Gwynne Edinger, MD;  Location: [place];  Service: Obstetrics;  Laterality: N/A;     A IV Location/Drains/Wounds Patient Lines/Drains/Airways Status   Active Line/Drains/Airways    Name:   Placement date:   Placement time:   Site:   Days:   Peripheral IV 10/21/18 Left Antecubital   10/21/18    2015    Antecubital   1   Peripheral IV 10/21/18 Right Antecubital   10/21/18    2156    Antecubital   1   Incision (Closed) 05/05/17 Abdomen   05/05/17    0931     535   Incision (Closed) 05/05/17 Perineum    05/05/17    0931     535          Intake/Output Last 24 hours  Intake/Output Summary (Last 24 hours) at 10/22/2018 0050 Last data filed at 10/22/2018 0007 Gross per 24 hour  Intake 100 ml  Output -  Net 100 ml    Labs/Imaging Results for orders placed or performed during the hospital encounter of 10/21/18 (from the past 48 hour(s))  Comprehensive metabolic panel     Status: Abnormal   Collection Time: 10/21/18  6:55 PM  Result Value Ref Range   Sodium 131 (L) 135 - 145 mmol/L   Potassium 3.4 (L) 3.5 - 5.1 mmol/L   Chloride 97 (L) 98 - 111 mmol/L   CO2 23 22 - 32 mmol/L   Glucose, Bld 137 (H) 70 - 99 mg/dL   BUN 5 (L) 6 - 20 mg/dL   Creatinine, Ser 0.85 0.44 - 1.00 mg/dL   Calcium 8.4 (L) 8.9 - 10.3 mg/dL   Total Protein 6.0 (L) 6.5 - 8.1 g/dL   Albumin 2.8 (L) 3.5 - 5.0 g/dL   AST 14 (L) 15 - 41 U/L   ALT 10 0 - 44 U/L   Alkaline Phosphatase 46 38 - 126 U/L   Total Bilirubin 0.5 0.3 - 1.2 mg/dL   GFR calc non Af Amer >60 >60 mL/min   GFR calc Af Amer >60 >60 mL/min   Anion gap 11 5 -  15    Comment: Performed at [facility], [place] [address] [place] [address]  CBC     Status: Abnormal   Collection Time: 10/21/18  6:55 PM  Result Value Ref Range   WBC 18.2 (H) 4.0 - 10.5 K/uL   RBC 1.82 (L) 3.87 - 5.11 MIL/uL   Hemoglobin 3.9 (LL) 12.0 - 15.0 g/dL    Comment: REPEATED TO VERIFY Reticulocyte Hemoglobin testing may be clinically indicated, consider ordering this additional test [id number] THIS CRITICAL RESULT HAS VERIFIED AND BEEN CALLED TO KELLY GIBSON,RN BY ZELDA BEECH ON 05 15 2020 AT 1921, AND HAS BEEN READ BACK.     HCT 13.1 (L) 36.0 - 46.0 %   MCV 72.0 (L) 80.0 - 100.0 fL   MCH 21.4 (L) 26.0 - 34.0 pg   MCHC 29.8 (L) 30.0 - 36.0 g/dL   RDW 18.2 (H) 11.5 - 15.5 %   Platelets 470 (H) 150 - 400 K/uL   nRBC 1.1 (H) 0.0 - 0.2 %    Comment: Performed at [facility] [address], [place], [place] [address]  Type and screen [place]     Status: None (Preliminary result)   Collection Time: 10/21/18  6:58 PM  Result Value Ref Range   ABO/RH(D) O POS    Antibody Screen POS    Sample Expiration 10/24/2018,2359    Antibody Identification      ANTI E Performed at [facility], [place] [address], [place], [place] [address]    Unit Number [id number]    Blood Component Type RED CELLS,LR    Unit division 00    Status of Unit ALLOCATED    Donor AG Type NEGATIVE FOR E ANTIGEN    Transfusion Status OK TO TRANSFUSE    Crossmatch Result COMPATIBLE    Unit Number [id number]    Blood Component Type RED CELLS,LR    Unit division 00    Status of Unit ISSUED    Donor AG Type NEGATIVE FOR E ANTIGEN    Transfusion Status OK TO TRANSFUSE    Crossmatch Result COMPATIBLE    Unit Number [id number]    Blood Component Type RED CELLS,LR    Unit division 00    Status of Unit ALLOCATED    Donor AG Type NEGATIVE FOR E ANTIGEN    Transfusion Status OK TO TRANSFUSE    Crossmatch Result COMPATIBLE    Unit Number [id number]    Blood Component Type RED CELLS,LR    Unit division 00    Status of Unit ALLOCATED    Donor AG Type NEGATIVE FOR E ANTIGEN    Transfusion Status OK TO TRANSFUSE    Crossmatch Result COMPATIBLE   I-Stat beta hCG blood, ED     Status: Abnormal   Collection Time: 10/21/18  8:04 PM  Result Value Ref Range   I-stat hCG, quantitative >2,000.0 (H) <5 mIU/mL   Comment 3            Comment:   GEST. AGE      CONC.  (mIU/mL)   <=1 WEEK        5 - 50     2 WEEKS       50 - 500     3 WEEKS       100 - 10,000     4 WEEKS     1,000 - 30,000        FEMALE AND NON-PREGNANT FEMALE:  LESS THAN 5 mIU/mL   Prepare RBC     Status: None   Collection Time: 10/21/18  8:25 PM  Result Value Ref Range   Order Confirmation      ORDER PROCESSED BY BLOOD BANK Performed at [facility], [place] [address] [place], [place] [address]   SARS Coronavirus 2 (CEPHEID - Performed in [facility]  hospital lab), Hosp Order     Status: None   Collection Time: 10/21/18  8:27 PM  Result Value Ref Range   SARS Coronavirus 2 NEGATIVE NEGATIVE    Comment: (NOTE) If result is NEGATIVE SARS-CoV-2 target nucleic acids are NOT DETECTED. The SARS-CoV-2 RNA is generally detectable in upper and lower  respiratory specimens during the acute phase of infection. The lowest  concentration of SARS-CoV-2 viral copies this assay can detect is 250  copies / mL. A negative result does not preclude SARS-CoV-2 infection  and should not be used as the sole basis for treatment or other  patient management decisions.  A negative result may occur with  improper specimen collection / handling, submission of specimen other  than nasopharyngeal swab, presence of viral mutation(s) within the  areas targeted by this assay, and inadequate number of viral copies  (<250 copies / mL). A negative result must be combined with clinical  observations, patient history, and epidemiological information. If result is POSITIVE SARS-CoV-2 target nucleic acids are DETECTED. The SARS-CoV-2 RNA is generally detectable in upper and lower  respiratory specimens dur ing the acute phase of infection.  Positive  results are indicative of active infection with SARS-CoV-2.  Clinical  correlation with patient history and other diagnostic information is  necessary to determine patient infection status.  Positive results do  not rule out bacterial infection or co-infection with other viruses. If result is PRESUMPTIVE POSTIVE SARS-CoV-2 nucleic acids MAY BE PRESENT.   A presumptive positive result was obtained on the submitted specimen  and confirmed on repeat testing.  While 2019 novel coronavirus  (SARS-CoV-2) nucleic acids may be present in the submitted sample  additional confirmatory testing may be necessary for epidemiological  and / or clinical management purposes  to differentiate between  SARS-CoV-2 and other Sarbecovirus  currently known to infect humans.  If clinically indicated additional testing with an alternate test  methodology [id number]) is advised. The SARS-CoV-2 RNA is generally  detectable in upper and lower respiratory sp ecimens during the acute  phase of infection. The expected result is Negative. Fact Sheet for Patients:  StrictlyIdeas.no Fact Sheet for Healthcare Providers: BankingDealers.co.za This test is not yet approved or cleared by the [place] FDA and has been authorized for detection and/or diagnosis of SARS-CoV-2 by FDA under an Emergency Use Authorization (EUA).  This EUA will remain in effect (meaning this test can be used) for the duration of the COVID-19 declaration under Section 564(b)(1) of the Act, 21 U.S.C. section 360bbb-3(b)(1), unless the authorization is terminated or revoked sooner. Performed at [facility], [place] [address], [place], [place] [address]    US Pelvic Complete With Transvaginal  Result Date: 10/21/2018 CLINICAL DATA:  Post D and C April 22nd, 3 weeks prior, vaginal bleeding. EXAM: TRANSABDOMINAL AND TRANSVAGINAL ULTRASOUND OF PELVIS TECHNIQUE: Both transabdominal and transvaginal ultrasound examinations of the pelvis were performed. Transabdominal technique was performed for global imaging of the pelvis including uterus, ovaries, adnexal regions, and pelvic cul-de-sac. It was necessary to proceed with endovaginal exam following the transabdominal exam to visualize the uterus and  endometrium. COMPARISON:  None FINDINGS: Uterus Measurements: 10 x 5.6 x 6.1 cm = volume: 175 mL. Within the lower uterine segment/cervix is a 12.5 x 5.6 x 10.2 mm a vascular cystic structure with central hyperechoic focus. Endometrium Thickness: 11 mm distally, heterogeneous but without internal flow. There is a large heterogeneous avascular structure expanding and extending from the lower endometrium through the vaginal canal  measuring 6.6 x 2.8 x 5.2 cm most consistent with clot. There is fluid in the vaginal canal. Right ovary No visualized, no adnexal mass. Left ovary Not visualized, no adnexal mass. Other findings No abnormal free fluid. IMPRESSION: Sonographic findings compatible with retained products of conception in the appropriate clinical setting. Large clot extending from the lower endometrial canal into the vagina. Heterogeneous endometrial thickening in the fundus at 11 mm, but without significant internal vascularity. Electronically Signed   By: Keith Rake M.D.   On: 10/21/2018 23:31    Pending Labs Unresulted Labs (From admission, onward)    Start     Ordered   10/22/18 0500  Protime-INR  Once,   R     10/22/18 0038   10/22/18 0500  APTT  Tomorrow morning,   R     10/22/18 0038   10/22/18 0500  CBC  Tomorrow morning,   R     10/22/18 0038   10/21/18 2158  hCG, quantitative, pregnancy  Add-on,   R     10/21/18 2157          Vitals/Pain Today's Vitals   10/22/18 0000 10/22/18 0015 10/22/18 0017 10/22/18 0030  BP: (!) 105/55 119/62  99/62  Pulse:      Resp: 13 12  19   Temp:      TempSrc:      SpO2: 100%   100%  Weight:      Height:      PainSc:   0-No pain     Isolation Precautions No active isolations  Medications Medications  0.9 %  sodium chloride infusion ( Intravenous New Bag/Given 10/21/18 2157)  prenatal multivitamin tablet 1 tablet (has no administration in time range)  ondansetron (ZOFRAN) tablet 4 mg (has no administration in time range)    Or  ondansetron (ZOFRAN) injection 4 mg (has no administration in time range)  0.9 %  sodium chloride infusion (10 mL/hr Intravenous New Bag/Given 10/21/18 2326)  tranexamic acid (CYKLOKAPRON) IVPB 1,000 mg (0 mg Intravenous Stopped 10/22/18 0007)  ondansetron (ZOFRAN) injection 4 mg (4 mg Intravenous Given 10/22/18 0013)    Mobility walks Low fall risk   Focused Assessments Cardiac Assessment Handoff:    Lab Results   Component Value Date   TROPONINI <0.03 08/02/2015   No results found for: DDIMER Does the Patient currently have chest pain? No     R Recommendations: See Admitting Provider Note  Report given to:   Additional Notes: First unit of blood infusing.  Delayed due to antibodies.  Tolerating well so far.

## 2018-10-22 NOTE — Discharge Instructions (Signed)
D&E (Dilation and Evacuation) Dilation and evacuation (D&E) is a minor operation. It involves stretching (dilation) the cervix and evacuation of the uterus. During the procedure, the cervix is dilated and tissue is gently suctioned from the inside of the uterus.  REASONS FOR DOING D&E Removal of retained placenta after giving birth.  Abortion. Miscarriage.  RISKS AND COMPLICATIONS Putting a hole (perforation) in the uterus. Excessive bleeding after the D&E.  Infection of the uterus.  Damage to the cervix.  Developing scar tissue (adhesions) inside the uterus, later causing abnormal bleeding or no monthly bleeding (amenorrhea) or problems with fertility. Complications from general or local anesthetic.     PROCEDURE You may be given a drug to make you sleep (general anesthetic) or a drug that numbs the area (local anesthetic) in and around the cervix.  You will lie on your back with your legs in stirrups.  A curved tool (suction curette) will be used to evacuate the uterus and will then be removed.  This usually takes around 15 to 30 minutes.  AFTER THE PROCEDURE You will rest in the recovery room until you are stable and feel ready to go home.  You may feel sick to your stomach (nauseous) or throw up (vomit) if you had general anesthesia.  You may have light cramping and bleeding for a couple days to 2 weeks after the procedure.  Your uterus needs to make new lining after a D&E. This may make your next period late.   HOME CARE INSTRUCTIONS Do not drive for 24 hours.  Wait 1 week before returning to strenuous activities.  You may resume your usual diet.  Drink enough water and fluids to keep your urine clear or pale yellow.  You should return to your usual bowel function. If constipation occurs, you may:  Take a mild laxative with permission from your caregiver.  Add fruit and bran to your diet.  Take showers instead of baths for two weeks Do not go swimming or use a hot tub until  your caregiver gives you permission.  Have someone with you or available for you the first 24 to 48 hours, especially if you had a general anesthetic.  Do not douche, use tampons, or have intercourse until after your follow-up appointment, or when your caregiver approves.  Only take over-the-counter or prescription medicines for pain, discomfort, or fever as directed by your caregiver. Do not take aspirin. It can cause bleeding.  If a prescription has been given to you, follow your caregiver's directions. You may be given a medicine that kills germs (antibiotic) to prevent an infection.  Keep all your follow-up appointments recommended by your caregiver.   SEEK MEDICAL CARE IF: You have increasing cramps or pain not relieved with medicine.  You develop belly (abdominal) pain, which does not seem to be related to the same area as your earlier cramping and pain.  You feel dizzy or feel like fainting.  You have a bad smelling vaginal discharge.  You develop a rash.  You develop a reaction or allergy to your medicine.   SEEK IMMEDIATE MEDICAL CARE IF: Bleeding is heavier than a normal menstrual period.  You have an oral temperature above 101F, not controlled by medicine.  You develop chest pain.  You develop shortness of breath.  You pass out.  You develop heavy vaginal bleeding with or without blood clots.   MAKE SURE YOU: Understand these instructions.  Will watch your condition.  Will get help right away if you are   not doing well or get worse.   UPDATED HEALTH PRACTICES A Pap smear is done to screen for cervical cancer.  The first Pap smear should be done at age 32.  Between ages 32 and 32, Pap smears are repeated every 2 years.  Beginning at age 32, you are advised to have a Pap smear every 3 years as long as your past 3 Pap smears have been normal.  Some women have medical problems that increase the chance of getting cervical cancer. Talk to your caregiver about these problems. It  is especially important to talk to your caregiver if a new problem develops soon after your last Pap smear. In these cases, your caregiver may recommend more frequent screening and Pap smears.  The above recommendations are the same for women who have or have not gotten the vaccine for HPV (human papillomavirus).  If you had a uterus removal (hysterectomy) for a problem that was not a cancer or a condition that could lead to cancer, then you no longer need Pap smears.  If you are between ages 32 and 70, and you have had normal Pap smears going back 10 years, you no longer need Pap smears.  If you have had past treatment for cervical cancer or a condition that could lead to cancer, you need Pap smears and screening for cancer for at least 32 years after your treatment.  Continue monthly breast self-examinations. Your caregiver can provide information and instructions for breast self-examination.  ExitCare Patient Information 2011 [employer].  

## 2018-10-22 NOTE — Transfer of Care (Signed)
Immediate Anesthesia Transfer of Care Note  Patient: Audrey Dougherty  Procedure(s) Performed: DILATATION AND EVACUATION FOR RETAINED PRODUCTS OF CONCEPTION (N/A )  Patient Location: PACU  Anesthesia Type:General  Level of Consciousness: responds to stimulation  Airway & Oxygen Therapy: Patient Spontanous Breathing  Post-op Assessment: Report given to RN and Post -op Vital signs reviewed and stable  Post vital signs: Reviewed and stable  Last Vitals:  Vitals Value Taken Time  BP    Temp    Pulse 84 10/22/2018  3:45 PM  Resp 14 10/22/2018  3:45 PM  SpO2 100 % 10/22/2018  3:45 PM  Vitals shown include unvalidated device data.  Last Pain:  Vitals:   10/22/18 1351  TempSrc: Oral  PainSc:          Complications: No apparent anesthesia complications

## 2018-10-22 NOTE — Anesthesia Procedure Notes (Signed)
Procedure Name: LMA Insertion Date/Time: 10/22/2018 3:12 PM Performed by: Babs Bertin, CRNA Pre-anesthesia Checklist: Patient identified, Emergency Drugs available, Suction available and Patient being monitored Patient Re-evaluated:Patient Re-evaluated prior to induction Oxygen Delivery Method: Circle System Utilized Preoxygenation: Pre-oxygenation with 100% oxygen Induction Type: IV induction Ventilation: Mask ventilation without difficulty LMA: LMA inserted LMA Size: 4.0 Number of attempts: 1 Airway Equipment and Method: Bite block Placement Confirmation: positive ETCO2 Tube secured with: Tape Dental Injury: Teeth and Oropharynx as per pre-operative assessment

## 2018-10-22 NOTE — Discharge Summary (Signed)
Physician Discharge Summary  Patient ID: Audrey Dougherty MRN: [id number] DOB/AGE: 07-24-1985 33 y.o.  Admit date: 10/21/2018 Discharge date: 10/22/2018  Admission Diagnoses:Retained POC and Acute anemia  Discharge Diagnoses:  Active Problems:   Incomplete abortion with complication   Discharged Condition: good  Hospital Course: Pt was admitted with above Dx. Transfused with PRBC's. Post transfusion Hgh 7.8. Taken to OR for D & C. See OP note for additional information. Was discharge home from out pt surgery on Motrin, iron and Percocet. Received Depo Provera prior to discharge as well. Discharge instructions and follow up reviewed with pt prior to discharge  Consults: None  Significant Diagnostic Studies: labs: CBC  Treatments: IV hydration, surgery: D & C and blood transfusion  Discharge Exam: Blood pressure (!) 119/92, pulse 85, temperature 98.3 F (36.8 C), temperature source Oral, resp. rate 16, height 5\' 5"  (1.651 m), weight 68 kg, SpO2 100 %, unknown if currently breastfeeding.  Lungs clear Heart RRR Abd soft + BS  GU See OP note Ext non tender  Disposition: Discharge disposition: 01-Home or Self Care       Discharge Instructions    Call MD for:  difficulty breathing, headache or visual disturbances   Complete by:  As directed    Call MD for:  extreme fatigue   Complete by:  As directed    Call MD for:  hives   Complete by:  As directed    Call MD for:  persistant dizziness or light-headedness   Complete by:  As directed    Call MD for:  persistant nausea and vomiting   Complete by:  As directed    Call MD for:  redness, tenderness, or signs of infection (pain, swelling, redness, odor or green/yellow discharge around incision site)   Complete by:  As directed    Call MD for:  severe uncontrolled pain   Complete by:  As directed    Call MD for:  temperature >100.4   Complete by:  As directed    Diet - low sodium heart healthy   Complete by:  As  directed    Increase activity slowly   Complete by:  As directed    Sexual Activity Restrictions   Complete by:  As directed    Pelvic rest x 4 weeks     Allergies as of 10/22/2018   No Known Allergies     Medication List    STOP taking these medications   acetaminophen 500 MG tablet Commonly known as:  TYLENOL     TAKE these medications   ferrous sulfate 325 (65 FE) MG tablet Take 1 tablet (325 mg total) by mouth 2 (two) times daily with a meal.   ibuprofen 800 MG tablet Commonly known as:  ADVIL Take 1 tablet (800 mg total) by mouth every 8 (eight) hours as needed. What changed:    medication strength  how much to take  when to take this  reasons to take this   multivitamin with minerals Tabs tablet Take 1 tablet by mouth daily.   oxyCODONE 5 MG immediate release tablet Commonly known as:  Oxy IR/ROXICODONE Take 1 tablet (5 mg total) by mouth every 6 (six) hours as needed for severe pain.      Follow-up [place] for [facility]. Schedule an appointment as soon as possible for a visit in 4 weeks.   Specialty:  Obstetrics and Gynecology Why:  Post op appt with Dr. Gardiner Fanti Contact information:  [place], [place] [id number] [place] [address] [phone]          Signed: Chancy Milroy 10/22/2018, 7:59 PM

## 2018-10-22 NOTE — ED Notes (Signed)
Update given to patients mother Brayton Layman per patient request.  Blood infusing at this time.  Tolerating well so far.  Tranexamic acid started per request of admitting physician.

## 2018-10-22 NOTE — Progress Notes (Signed)
OB/GYN Attending  Pt has completed blood transfusion without problems. No complaints at present.  PE AF VSS Lungs clear Heart RRR Abd soft + BS GU deferred to OR  A/P Retained POC        Anemia secondary to above  Pt has been transfused with PRBC's with good response. NPO For D & C. Consent obtained R/B/Post op care reviewed with pt Verbalized understanding and agrees to proceed. Doxycycline on call to OR

## 2018-10-22 NOTE — Anesthesia Preprocedure Evaluation (Signed)
Anesthesia Evaluation  Patient identified by MRN, date of birth, ID band Patient awake    Reviewed: Allergy & Precautions, NPO status , Patient's Chart, lab work & pertinent test results  Airway Mallampati: II  TM Distance: >3 FB Neck ROM: Full    Dental  (+) Teeth Intact, Dental Advisory Given   Pulmonary    breath sounds clear to auscultation       Cardiovascular  Rhythm:Regular Rate:Normal     Neuro/Psych    GI/Hepatic   Endo/Other    Renal/GU      Musculoskeletal   Abdominal   Peds  Hematology   Anesthesia Other Findings   Reproductive/Obstetrics                             Anesthesia Physical Anesthesia Plan  ASA: II  Anesthesia Plan: General   Post-op Pain Management:    Induction: Intravenous  PONV Risk Score and Plan: Ondansetron and Dexamethasone  Airway Management Planned: LMA  Additional Equipment:   Intra-op Plan:   Post-operative Plan:   Informed Consent: I have reviewed the patients History and Physical, chart, labs and discussed the procedure including the risks, benefits and alternatives for the proposed anesthesia with the patient or authorized representative who has indicated his/her understanding and acceptance.     Dental advisory given  Plan Discussed with: CRNA and Anesthesiologist  Anesthesia Plan Comments:         Anesthesia Quick Evaluation

## 2018-10-23 ENCOUNTER — Encounter (HOSPITAL_COMMUNITY): Payer: Self-pay | Admitting: Obstetrics and Gynecology

## 2018-10-24 LAB — TYPE AND SCREEN
ABO/RH(D): O POS
Antibody Screen: POSITIVE
Donor AG Type: NEGATIVE
Donor AG Type: NEGATIVE
Donor AG Type: NEGATIVE
Donor AG Type: NEGATIVE
Unit division: 0
Unit division: 0
Unit division: 0
Unit division: 0

## 2018-10-24 LAB — BPAM RBC
Blood Product Expiration Date: 202006052359
Blood Product Expiration Date: 202006062359
Blood Product Expiration Date: 202006102359
Blood Product Expiration Date: 202006132359
ISSUE DATE / TIME: 202005152317
ISSUE DATE / TIME: 202005160153
ISSUE DATE / TIME: 202005160459
Unit Type and Rh: 5100
Unit Type and Rh: 5100
Unit Type and Rh: 5100
Unit Type and Rh: 5100

## 2019-02-16 DIAGNOSIS — Z719 Counseling, unspecified: Secondary | ICD-10-CM

## 2019-02-17 ENCOUNTER — Other Ambulatory Visit: Payer: Self-pay

## 2019-02-17 ENCOUNTER — Emergency Department (HOSPITAL_BASED_OUTPATIENT_CLINIC_OR_DEPARTMENT_OTHER): Payer: Medicaid Other

## 2019-02-17 ENCOUNTER — Encounter (HOSPITAL_BASED_OUTPATIENT_CLINIC_OR_DEPARTMENT_OTHER): Payer: Self-pay | Admitting: *Deleted

## 2019-02-17 ENCOUNTER — Emergency Department (HOSPITAL_BASED_OUTPATIENT_CLINIC_OR_DEPARTMENT_OTHER)
Admission: EM | Admit: 2019-02-17 | Discharge: 2019-02-17 | Disposition: A | Discharge status: Home / Self Care | Payer: Medicaid Other | Attending: Emergency Medicine | Admitting: Emergency Medicine

## 2019-02-17 DIAGNOSIS — Z3202 Encounter for pregnancy test, result negative: Secondary | ICD-10-CM | POA: Insufficient documentation

## 2019-02-17 DIAGNOSIS — Y92410 Unspecified street and highway as the place of occurrence of the external cause: Secondary | ICD-10-CM | POA: Insufficient documentation

## 2019-02-17 DIAGNOSIS — Y999 Unspecified external cause status: Secondary | ICD-10-CM | POA: Diagnosis not present

## 2019-02-17 DIAGNOSIS — S39012A Strain of muscle, fascia and tendon of lower back, initial encounter: Secondary | ICD-10-CM

## 2019-02-17 DIAGNOSIS — S3992XA Unspecified injury of lower back, initial encounter: Secondary | ICD-10-CM | POA: Diagnosis present

## 2019-02-17 DIAGNOSIS — Z79899 Other long term (current) drug therapy: Secondary | ICD-10-CM | POA: Insufficient documentation

## 2019-02-17 DIAGNOSIS — Z87891 Personal history of nicotine dependence: Secondary | ICD-10-CM | POA: Insufficient documentation

## 2019-02-17 DIAGNOSIS — Y9389 Activity, other specified: Secondary | ICD-10-CM | POA: Diagnosis not present

## 2019-02-17 LAB — URINALYSIS, ROUTINE W REFLEX MICROSCOPIC
Bilirubin Urine: NEGATIVE
Glucose, UA: NEGATIVE mg/dL
Ketones, ur: NEGATIVE mg/dL
Nitrite: NEGATIVE
Protein, ur: NEGATIVE mg/dL
Specific Gravity, Urine: >1.03 — ABNORMAL HIGH (ref 1.005–1.030)
pH: 5.5 (ref 5.0–8.0)

## 2019-02-17 LAB — URINALYSIS, MICROSCOPIC (REFLEX)

## 2019-02-17 LAB — PREGNANCY, URINE: Preg Test, Ur: NEGATIVE

## 2019-02-17 MED ORDER — CYCLOBENZAPRINE HCL 10 MG PO TABS: 10.0000 mg | ORAL_TABLET | Freq: Two times a day (BID) | ORAL | 0 refills | Status: DC | PRN

## 2019-02-17 MED ORDER — IBUPROFEN 800 MG PO TABS: 800.0000 mg | ORAL_TABLET | Freq: Three times a day (TID) | ORAL | 0 refills | Status: DC | PRN
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
## 2019-02-17 NOTE — ED Notes (Signed)
ED Provider at bedside. 
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
## 2019-05-10 ENCOUNTER — Other Ambulatory Visit: Payer: Self-pay

## 2019-05-10 DIAGNOSIS — Z20822 Contact with and (suspected) exposure to covid-19: Secondary | ICD-10-CM

## 2019-05-13 LAB — NOVEL CORONAVIRUS, NAA: SARS-CoV-2, NAA: NOT DETECTED

## 2019-07-10 DIAGNOSIS — A539 Syphilis, unspecified: Secondary | ICD-10-CM

## 2019-07-10 HISTORY — DX: Syphilis, unspecified: A53.9

## 2019-08-01 ENCOUNTER — Encounter (HOSPITAL_BASED_OUTPATIENT_CLINIC_OR_DEPARTMENT_OTHER): Payer: Self-pay | Admitting: *Deleted

## 2019-08-01 ENCOUNTER — Other Ambulatory Visit: Payer: Self-pay

## 2019-08-01 ENCOUNTER — Emergency Department (HOSPITAL_BASED_OUTPATIENT_CLINIC_OR_DEPARTMENT_OTHER)
Admission: EM | Admit: 2019-08-01 | Discharge: 2019-08-01 | Disposition: A | Discharge status: Home / Self Care | Payer: Medicaid Other | Attending: Emergency Medicine | Admitting: Emergency Medicine

## 2019-08-01 DIAGNOSIS — N760 Acute vaginitis: Secondary | ICD-10-CM | POA: Diagnosis not present

## 2019-08-01 DIAGNOSIS — A599 Trichomoniasis, unspecified: Secondary | ICD-10-CM

## 2019-08-01 DIAGNOSIS — B9689 Other specified bacterial agents as the cause of diseases classified elsewhere: Secondary | ICD-10-CM

## 2019-08-01 DIAGNOSIS — Z202 Contact with and (suspected) exposure to infections with a predominantly sexual mode of transmission: Secondary | ICD-10-CM | POA: Diagnosis present

## 2019-08-01 LAB — URINALYSIS, ROUTINE W REFLEX MICROSCOPIC
Bilirubin Urine: NEGATIVE
Glucose, UA: NEGATIVE mg/dL
Ketones, ur: NEGATIVE mg/dL
Nitrite: NEGATIVE
Protein, ur: NEGATIVE mg/dL
Specific Gravity, Urine: 1.02 (ref 1.005–1.030)
pH: 6.5 (ref 5.0–8.0)

## 2019-08-01 LAB — URINALYSIS, MICROSCOPIC (REFLEX)

## 2019-08-01 LAB — HIV ANTIBODY (ROUTINE TESTING W REFLEX): HIV Screen 4th Generation wRfx: NONREACTIVE

## 2019-08-01 LAB — PREGNANCY, URINE: Preg Test, Ur: NEGATIVE

## 2019-08-01 LAB — WET PREP, GENITAL
Sperm: NONE SEEN
Yeast Wet Prep HPF POC: NONE SEEN

## 2019-08-01 MED ORDER — METRONIDAZOLE 500 MG PO TABS: 500.0000 mg | ORAL_TABLET | Freq: Two times a day (BID) | ORAL | 0 refills | Status: DC

## 2019-08-01 MED FILL — metroNIDAZOLE 500 MG TABS: 500 | 7 days supply | Qty: 14 | Fill #0
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
## 2019-08-01 NOTE — Discharge Instructions (Signed)
You were seen in the emergency department today due to an STD exposure.  Your labs showed findings of trichomonas as well as bacterial vaginosis.  Bacterial vaginosis is not a sexually transmitted infection.   We are treating each of these infections with an antibiotic from them with Flagyl, please take this for the next 1 week.  Do not drink alcohol with Flagyl as it can be extremely dangerous.  We have prescribed you new medication(s) today. Discuss the medications prescribed today with your pharmacist as they can have adverse effects and interactions with your other medicines including over the counter and prescribed medications. Seek medical evaluation if you start to experience new or abnormal symptoms after taking one of these medicines, seek care immediately if you start to experience difficulty breathing, feeling of your throat closing, facial swelling, or rash as these could be indications of a more serious allergic reaction  We have tested you for gonorrhea, chlamydia, HIV, and syphilis as well, we will call you if results are positive.  If these are positive you will need to seek treatment.  Please inform all sexual partners of your positive STD test so that they may be evaluated as well.  Please follow-up with your primary care provider and/or the health department within 1 week for reevaluation.  Return to the emergency department for new or worsening symptoms including but not limited to fever, abdominal pain, or any other concerns.  Your blood pressure was also noted to be elevated in the emergency department, please have this rechecked by her primary care provider within 1 to 2 weeks. Vitals:   08/01/19 1350  BP: (!) 153/111  Pulse: (!) 101  Resp: 16  Temp: 98.2 F (36.8 C)  SpO2: 100%
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
## 2019-08-01 NOTE — ED Triage Notes (Signed)
STD exposure. Slight vaginal discharge.

## 2019-08-02 LAB — RPR
RPR Ser Ql: REACTIVE — AB
RPR Titer: 1:32 {titer}

## 2019-08-03 LAB — GC/CHLAMYDIA PROBE AMP (~~LOC~~) NOT AT ARMC
Chlamydia: NEGATIVE
Neisseria Gonorrhea: NEGATIVE

## 2019-08-03 LAB — T.PALLIDUM AB, TOTAL: T Pallidum Abs: REACTIVE — AB

## 2019-08-04 ENCOUNTER — Other Ambulatory Visit: Payer: Self-pay

## 2019-08-04 ENCOUNTER — Encounter (HOSPITAL_BASED_OUTPATIENT_CLINIC_OR_DEPARTMENT_OTHER): Payer: Self-pay | Admitting: Student

## 2019-08-04 ENCOUNTER — Emergency Department (HOSPITAL_BASED_OUTPATIENT_CLINIC_OR_DEPARTMENT_OTHER)
Admission: EM | Admit: 2019-08-04 | Discharge: 2019-08-04 | Disposition: A | Discharge status: Home / Self Care | Payer: Medicaid Other | Attending: Emergency Medicine | Admitting: Emergency Medicine

## 2019-08-04 DIAGNOSIS — Z79899 Other long term (current) drug therapy: Secondary | ICD-10-CM | POA: Diagnosis not present

## 2019-08-04 DIAGNOSIS — A539 Syphilis, unspecified: Secondary | ICD-10-CM | POA: Insufficient documentation

## 2019-08-04 DIAGNOSIS — Z87891 Personal history of nicotine dependence: Secondary | ICD-10-CM | POA: Insufficient documentation

## 2019-08-04 MED ORDER — PENICILLIN G BENZATHINE 1200000 UNIT/2ML IM SUSP
2.4000 10*6.[IU] | Freq: Once | INTRAMUSCULAR | Status: AC
  Administered 2019-08-04: 12:00:00 2.4 10*6.[IU] via INTRAMUSCULAR
  Filled 2019-08-04: qty 4

## 2019-08-04 NOTE — ED Provider Notes (Signed)
[facility] EMERGENCY DEPARTMENT Provider Note   CSN: [id number] Arrival date & time: 08/04/19  1142     History Chief Complaint  Patient presents with  . Follow-up    Audrey Dougherty is a 34 y.o. female with a hx of trichomoniasis who returns to the ED for treatment due to testing positive for Syphilis. I personally saw patient 02/23 for vaginal discharge, she had full STD testing @ that time, tested positive for trichomonas and was also noted to have BV on her wet prep therefore was given treatment was 500 mg Flagyl BID for 7 days. She states she is taking this medication as prescribed and doing well.  No specific alleviating or aggravating factors.  She received a call today that her syphilis test came back positive and she needed treatment.  She states that she already has inform her sexual partner.  She denies fever, chills, nausea, vomiting, pelvic pain, dysuria, numbness, weakness, headaches, or visual disturbance.     HPI     Past Medical History:  Diagnosis Date  . Hx of trichomoniasis 09/05/2016  . Placenta previa   . Preterm labor     Patient Active Problem List   Diagnosis Date Noted  . Incomplete abortion with complication [id number]  . Status post repeat low transverse cesarean section 05/05/2017    Past Surgical History:  Procedure Laterality Date  . CESAREAN SECTION    . CESAREAN SECTION N/A 05/05/2017   Procedure: REPEAT CESAREAN SECTION;  Surgeon: Gwynne Edinger, MD;  Location: [facility];  Service: Obstetrics;  Laterality: N/A;  . DILATION AND EVACUATION N/A 10/22/2018   Procedure: DILATATION AND EVACUATION FOR RETAINED PRODUCTS OF CONCEPTION;  Surgeon: Chancy Milroy, MD;  Location: [facility];  Service: Gynecology;  Laterality: N/A;     OB History    Gravida  3   Para  2   Term  1   Preterm  1   AB      Living  2     SAB      TAB      Ectopic      Multiple      Live Births  2           Family History   Problem Relation Age of Onset  . Diabetes Paternal Grandmother   . Anesthesia problems Neg Hx   . Hypotension Neg Hx   . Malignant hyperthermia Neg Hx   . Pseudochol deficiency Neg Hx     Social History   Tobacco Use  . Smoking status: Former Smoker    Packs/day: 0.25    Types: Cigarettes    Quit date: 01/26/2017    Years since quitting: 2.5  . Smokeless tobacco: Never Used  Substance Use Topics  . Alcohol use: No  . Drug use: No    Home Medications Prior to Admission medications   Medication Sig Start Date End Date Taking? Authorizing Provider  cyclobenzaprine (FLEXERIL) 10 MG tablet Take 1 tablet (10 mg total) by mouth 2 (two) times daily as needed for muscle spasms. 02/17/19   Isla Pence, MD  ferrous sulfate 325 (65 FE) MG tablet Take 1 tablet (325 mg total) by mouth 2 (two) times daily with a meal. 05/07/17   Diallo, Abdoulaye, MD  ibuprofen (ADVIL) 800 MG tablet Take 1 tablet (800 mg total) by mouth every 8 (eight) hours as needed. 02/17/19   Isla Pence, MD  metroNIDAZOLE (FLAGYL) 500 MG tablet Take 1 tablet (500  mg total) by mouth 2 (two) times daily. 08/01/19   Antwione Picotte R, PA-C  Multiple Vitamin (MULTIVITAMIN WITH MINERALS) TABS tablet Take 1 tablet by mouth daily.    [provider]  oxyCODONE (OXY IR/ROXICODONE) 5 MG immediate release tablet Take 1 tablet (5 mg total) by mouth every 6 (six) hours as needed for severe pain. 10/22/18   Chancy Milroy, MD    Allergies    Patient has no known allergies.  Review of Systems   Review of Systems  Constitutional: Negative for chills and fever.  Eyes: Negative for visual disturbance.  Respiratory: Negative for shortness of breath.   Cardiovascular: Negative for chest pain.  Gastrointestinal: Negative for nausea and vomiting.  Genitourinary: Positive for vaginal discharge (improving). Negative for pelvic pain.  Neurological: Negative for dizziness, seizures, syncope, facial asymmetry, speech  difficulty, weakness, numbness and headaches.    Physical Exam Updated Vital Signs BP (!) 149/99 (BP Location: Right Arm)   Pulse 92   Temp 98.2 F (36.8 C) (Oral)   Resp 18   Ht 5\' 5"  (1.651 m)   Wt 70.8 kg   LMP 07/23/2019   SpO2 100%   BMI 25.96 kg/m   Physical Exam Vitals and nursing note reviewed.  Constitutional:      General: She is not in acute distress.    Appearance: She is well-developed.  HENT:     Head: Normocephalic and atraumatic.  Eyes:     General:        Right eye: No discharge.        Left eye: No discharge.     Conjunctiva/sclera: Conjunctivae normal.  Neurological:     Mental Status: She is alert.     Comments: Clear speech.   Psychiatric:        Behavior: Behavior normal.        Thought Content: Thought content normal.     ED Results / Procedures / Treatments   Labs (all labs ordered are listed, but only abnormal results are displayed) Labs Reviewed - No data to display  EKG None  Radiology No results found.  Procedures Procedures (including critical care time)  Medications Ordered in ED Medications  penicillin g benzathine (BICILLIN LA) 1200000 UNIT/2ML injection 2.4 Million Units (has no administration in time range)    ED Course  I have reviewed the triage vital signs and the nursing notes.  Pertinent labs & imaging results that were available during my care of the patient were reviewed by me and considered in my medical decision making (see chart for details).    MDM Rules/Calculators/A&P                     Patient returns to the ED for positive RPR.  I personally saw patient @ last ED visit- treatment for trich/BV initiated @ that time. Negative GC/chlamydia & HIV testing. RPR returned positive. No specific concern for neuro/ocular syphilis. Treatment with Bicillin IM in the ED, health department follow up. She has already informed sexual partners per report. Discussed need for abstinence to avoid re-infection. I discussed  results, treatment plan, need for follow-up, and return precautions with the patient. Provided opportunity for questions, patient confirmed understanding and is in agreement with plan.   Final Clinical Impression(s) / ED Diagnoses Final diagnoses:  Syphilis    Rx / DC Orders ED Discharge Orders    None       Amaryllis Dyke, PA-C 08/04/19 1206  Lennice Sites, DO 08/04/19 1216

## 2019-08-04 NOTE — Discharge Instructions (Signed)
You were seen in the ER today for a positive syphilis test.  We have treated this with a shot of penicillin in the emergency department.  Be sure to inform all sexual partners so that they can be evaluated.  Do not participate in intercourse of any kind for a minimum of 7 days to prevent re-infection.   Follow-up with the health department within 1 week.  Return to the emergency department for new or worsening symptoms including but not limited to general discharge, pelvic pain, confusion, numbness, weakness, headaches, rash, or any other concerns.

## 2019-08-04 NOTE — ED Triage Notes (Signed)
Pt returned for STD treatment-NAD-steady gait

## 2019-08-06 ENCOUNTER — Other Ambulatory Visit (INDEPENDENT_AMBULATORY_CARE_PROVIDER_SITE_OTHER): Payer: Self-pay

## 2019-09-04 ENCOUNTER — Encounter (HOSPITAL_BASED_OUTPATIENT_CLINIC_OR_DEPARTMENT_OTHER): Payer: Self-pay | Admitting: Emergency Medicine

## 2019-09-04 ENCOUNTER — Other Ambulatory Visit: Payer: Self-pay

## 2019-09-04 ENCOUNTER — Emergency Department (HOSPITAL_BASED_OUTPATIENT_CLINIC_OR_DEPARTMENT_OTHER)
Admission: EM | Admit: 2019-09-04 | Discharge: 2019-09-04 | Disposition: A | Discharge status: Home / Self Care | Payer: Medicaid Other | Attending: Emergency Medicine | Admitting: Emergency Medicine

## 2019-09-04 DIAGNOSIS — N898 Other specified noninflammatory disorders of vagina: Secondary | ICD-10-CM | POA: Diagnosis not present

## 2019-09-04 DIAGNOSIS — Z202 Contact with and (suspected) exposure to infections with a predominantly sexual mode of transmission: Secondary | ICD-10-CM | POA: Insufficient documentation

## 2019-09-04 DIAGNOSIS — Z87891 Personal history of nicotine dependence: Secondary | ICD-10-CM | POA: Diagnosis not present

## 2019-09-04 DIAGNOSIS — Z711 Person with feared health complaint in whom no diagnosis is made: Secondary | ICD-10-CM

## 2019-09-04 LAB — URINALYSIS, MICROSCOPIC (REFLEX)

## 2019-09-04 LAB — WET PREP, GENITAL
Clue Cells Wet Prep HPF POC: NONE SEEN
Sperm: NONE SEEN
Trich, Wet Prep: NONE SEEN
Yeast Wet Prep HPF POC: NONE SEEN

## 2019-09-04 LAB — URINALYSIS, ROUTINE W REFLEX MICROSCOPIC
Bilirubin Urine: NEGATIVE
Glucose, UA: NEGATIVE mg/dL
Ketones, ur: NEGATIVE mg/dL
Leukocytes,Ua: NEGATIVE
Nitrite: NEGATIVE
Protein, ur: NEGATIVE mg/dL
Specific Gravity, Urine: >1.03 — ABNORMAL HIGH (ref 1.005–1.030)
pH: 6 (ref 5.0–8.0)

## 2019-09-04 LAB — PREGNANCY, URINE: Preg Test, Ur: NEGATIVE

## 2019-09-04 LAB — HIV ANTIBODY (ROUTINE TESTING W REFLEX): HIV Screen 4th Generation wRfx: NONREACTIVE

## 2019-09-04 NOTE — ED Provider Notes (Signed)
[facility] EMERGENCY DEPARTMENT Provider Note   CSN: [id number] Arrival date & time: 09/04/19  1019    History Chief Complaint  Patient presents with  . Vaginal Discharge   Audrey Dougherty is a 34 y.o. female with past medical history significant for trichomonas, syphilis who presents for evaluation of vaginal discharge.  Recently completed antibiotics for trichomonas as well as syphilis 5 weeks ago. Continued vag dc which is white and thick in nature.  Denies fever, chills, nausea, vomiting, pelvic pain, dysuria, abnormal vaginal discharge.  She is currently on her menstrual cycle.  She was compliant and completed her course of antibiotics after the trichomonas.  Was abstinent after the antibiotics however is not sexually active.  Has not followed with PCP.  Denies additional aggravating or alleviating factors.  History obtained from patient and past medical record.  No interpreter is used.  HPI     Past Medical History:  Diagnosis Date  . Hx of trichomoniasis 09/05/2016  . Placenta previa   . Preterm labor     Patient Active Problem List   Diagnosis Date Noted  . Incomplete abortion with complication [id number]  . Status post repeat low transverse cesarean section 05/05/2017    Past Surgical History:  Procedure Laterality Date  . CESAREAN SECTION    . CESAREAN SECTION N/A 05/05/2017   Procedure: REPEAT CESAREAN SECTION;  Surgeon: Gwynne Edinger, MD;  Location: [facility];  Service: Obstetrics;  Laterality: N/A;  . DILATION AND EVACUATION N/A 10/22/2018   Procedure: DILATATION AND EVACUATION FOR RETAINED PRODUCTS OF CONCEPTION;  Surgeon: Chancy Milroy, MD;  Location: [facility];  Service: Gynecology;  Laterality: N/A;     OB History    Gravida  3   Para  2   Term  1   Preterm  1   AB      Living  2     SAB      TAB      Ectopic      Multiple      Live Births  2           Family History  Problem Relation Age of Onset    . Diabetes Paternal Grandmother   . Anesthesia problems Neg Hx   . Hypotension Neg Hx   . Malignant hyperthermia Neg Hx   . Pseudochol deficiency Neg Hx     Social History   Tobacco Use  . Smoking status: Former Smoker    Packs/day: 0.25    Types: Cigarettes    Quit date: 01/26/2017    Years since quitting: 2.6  . Smokeless tobacco: Never Used  Substance Use Topics  . Alcohol use: No  . Drug use: No    Home Medications Prior to Admission medications   Medication Sig Start Date End Date Taking? Authorizing Provider  cyclobenzaprine (FLEXERIL) 10 MG tablet Take 1 tablet (10 mg total) by mouth 2 (two) times daily as needed for muscle spasms. 02/17/19   Isla Pence, MD  ferrous sulfate 325 (65 FE) MG tablet Take 1 tablet (325 mg total) by mouth 2 (two) times daily with a meal. 05/07/17   Diallo, Abdoulaye, MD  ibuprofen (ADVIL) 800 MG tablet Take 1 tablet (800 mg total) by mouth every 8 (eight) hours as needed. 02/17/19   Isla Pence, MD  metroNIDAZOLE (FLAGYL) 500 MG tablet Take 1 tablet (500 mg total) by mouth 2 (two) times daily. 08/01/19   Petrucelli, Glynda Jaeger, PA-C  Multiple Vitamin (  MULTIVITAMIN WITH MINERALS) TABS tablet Take 1 tablet by mouth daily.    [provider]  oxyCODONE (OXY IR/ROXICODONE) 5 MG immediate release tablet Take 1 tablet (5 mg total) by mouth every 6 (six) hours as needed for severe pain. 10/22/18   Chancy Milroy, MD    Allergies    Patient has no known allergies.  Review of Systems   Review of Systems  Constitutional: Negative.   HENT: Negative.   Respiratory: Negative.   Cardiovascular: Negative.   Gastrointestinal: Negative.   Genitourinary: Positive for vaginal discharge.  Musculoskeletal: Negative.   Skin: Negative.   Neurological: Negative.   All other systems reviewed and are negative.  Physical Exam Updated Vital Signs BP (!) 136/94 (BP Location: Left Arm)   Pulse 95   Temp 98.2 F (36.8 C) (Oral)   Resp 16    Ht 5\' 5"  (1.651 m)   Wt 70.7 kg   SpO2 100%   BMI 25.93 kg/m   Physical Exam Vitals and nursing note reviewed. Exam conducted with a chaperone present.  Constitutional:      General: She is not in acute distress.    Appearance: She is well-developed. She is not ill-appearing, toxic-appearing or diaphoretic.  HENT:     Head: Normocephalic and atraumatic.     Nose: Nose normal.     Mouth/Throat:     Mouth: Mucous membranes are moist.     Pharynx: Oropharynx is clear.  Eyes:     Pupils: Pupils are equal, round, and reactive to light.  Cardiovascular:     Rate and Rhythm: Normal rate.     Pulses: Normal pulses.     Heart sounds: Normal heart sounds.  Pulmonary:     Effort: Pulmonary effort is normal. No respiratory distress.     Breath sounds: Normal breath sounds.  Abdominal:     General: Bowel sounds are normal. There is no distension.     Tenderness: There is no abdominal tenderness. There is no right CVA tenderness, left CVA tenderness, guarding or rebound.  Genitourinary:    Comments: Normal appearing external female genitalia without rashes or lesions, normal vaginal epithelium. Normal appearing cervix without discharge or petechiae. Cervical os is closed. There is bleeding noted at the os. No Odor. Bimanual: No CMT, nontender.  No palpable adnexal masses or tenderness. Uterus midline and not fixed. Rectovaginal exam was deferred.  No cystocele or rectocele noted. No pelvic lymphadenopathy noted. Wet prep was obtained.  Cultures for gonorrhea and chlamydia collected. Exam performed with chaperone in room. Musculoskeletal:        General: Normal range of motion.     Cervical back: Normal range of motion.  Skin:    General: Skin is warm and dry.     Capillary Refill: Capillary refill takes less than 2 seconds.  Neurological:     General: No focal deficit present.     Mental Status: She is alert and oriented to person, place, and time.    ED Results / Procedures / Treatments    Labs (all labs ordered are listed, but only abnormal results are displayed) Labs Reviewed  WET PREP, GENITAL - Abnormal; Notable for the following components:      Result Value   WBC, Wet Prep HPF POC FEW (*)    All other components within normal limits  URINALYSIS, ROUTINE W REFLEX MICROSCOPIC - Abnormal; Notable for the following components:   Specific Gravity, Urine >1.030 (*)    Hgb urine dipstick LARGE (*)  All other components within normal limits  URINALYSIS, MICROSCOPIC (REFLEX) - Abnormal; Notable for the following components:   Bacteria, UA FEW (*)    All other components within normal limits  PREGNANCY, URINE  RPR  HIV ANTIBODY (ROUTINE TESTING W REFLEX)  GC/CHLAMYDIA PROBE AMP ([place]) NOT AT [facility]    EKG None  Radiology No results found.  Procedures Procedures (including critical care time)  Medications Ordered in ED Medications - No data to display  ED Course  I have reviewed the triage vital signs and the nursing notes.  Pertinent labs & imaging results that were available during my care of the patient were reviewed by me and considered in my medical decision making (see chart for details).  34 year old presents for evaluation of vaginal discharge.  Diagnosed with trichomonas and syphilis 5 weeks ago. Vag dc with white thick dc. No pelvic/ abd pain. Tolerating PO intake at home. Pelvic with blood however patient on cycle. No CMT, adnexal tenderness.  Abdomen soft, nontender denies any rashes or lesions.  Patient any neurologic/ocular complaints post positive syphilis testing with treatment.  UA with blood (On cycle) No UTI, Trich Wt Prep few WBC, No yeast, Trich, BV.  Pt presents with concerns for possible STD.  Pt understands that they have GC/Chlamydia cultures pending and that they will need to inform all sexual partners if results return positive. Declines empiric Abx for GC/Chla. Pt not concerning for PID because hemodynamically stable and no  cervical motion tenderness on pelvic exam.  Patient to be discharged with instructions to follow up with OBGYN/PCP. Discussed importance of using protection when sexually active.   The patient has been appropriately medically screened and/or stabilized in the ED. I have low suspicion for any other emergent medical condition which would require further screening, evaluation or treatment in the ED or require inpatient management.  MDM Rules/Calculators/A&P                       Final Clinical Impression(s) / ED Diagnoses Final diagnoses:  Concern about STD in female without diagnosis  Vaginal discharge    Rx / DC Orders ED Discharge Orders    None       Jayni Prescher A, PA-C 09/04/19 1302    Hayden Rasmussen, MD 09/04/19 1910

## 2019-09-04 NOTE — ED Triage Notes (Signed)
Pt presents with c/o vaginal discharge after completing meds for trich.

## 2019-09-04 NOTE — ED Notes (Signed)
Pt discharged to home NAD.  

## 2019-09-04 NOTE — Discharge Instructions (Addendum)
You will be called if your STD testing is positive.  Do not have sexual intercourse until your results return.

## 2019-09-05 LAB — GC/CHLAMYDIA PROBE AMP (~~LOC~~) NOT AT ARMC
Chlamydia: NEGATIVE
Neisseria Gonorrhea: NEGATIVE

## 2019-09-05 LAB — RPR
RPR Ser Ql: REACTIVE — AB
RPR Titer: 1:8 {titer}

## 2019-09-06 LAB — T.PALLIDUM AB, TOTAL: T Pallidum Abs: REACTIVE — AB

## 2019-10-30 ENCOUNTER — Observation Stay (HOSPITAL_COMMUNITY)
Admission: EM | Admit: 2019-10-30 | Discharge: 2019-10-31 | Disposition: A | Discharge status: Home / Self Care | Payer: Medicaid Other | Attending: Internal Medicine | Admitting: Internal Medicine

## 2019-10-30 ENCOUNTER — Other Ambulatory Visit: Payer: Self-pay

## 2019-10-30 ENCOUNTER — Encounter (HOSPITAL_COMMUNITY): Payer: Self-pay | Admitting: Emergency Medicine

## 2019-10-30 ENCOUNTER — Emergency Department (HOSPITAL_COMMUNITY): Payer: Medicaid Other

## 2019-10-30 ENCOUNTER — Observation Stay (HOSPITAL_BASED_OUTPATIENT_CLINIC_OR_DEPARTMENT_OTHER): Payer: Medicaid Other

## 2019-10-30 DIAGNOSIS — Z8619 Personal history of other infectious and parasitic diseases: Secondary | ICD-10-CM | POA: Insufficient documentation

## 2019-10-30 DIAGNOSIS — R778 Other specified abnormalities of plasma proteins: Secondary | ICD-10-CM | POA: Diagnosis not present

## 2019-10-30 DIAGNOSIS — D72829 Elevated white blood cell count, unspecified: Secondary | ICD-10-CM | POA: Diagnosis present

## 2019-10-30 DIAGNOSIS — T405X2A Poisoning by cocaine, intentional self-harm, initial encounter: Secondary | ICD-10-CM

## 2019-10-30 DIAGNOSIS — E875 Hyperkalemia: Secondary | ICD-10-CM | POA: Diagnosis not present

## 2019-10-30 DIAGNOSIS — D75839 Thrombocytosis, unspecified: Secondary | ICD-10-CM | POA: Diagnosis present

## 2019-10-30 DIAGNOSIS — R55 Syncope and collapse: Secondary | ICD-10-CM | POA: Diagnosis not present

## 2019-10-30 DIAGNOSIS — F1010 Alcohol abuse, uncomplicated: Secondary | ICD-10-CM | POA: Insufficient documentation

## 2019-10-30 DIAGNOSIS — Z8751 Personal history of pre-term labor: Secondary | ICD-10-CM | POA: Insufficient documentation

## 2019-10-30 DIAGNOSIS — T405X1A Poisoning by cocaine, accidental (unintentional), initial encounter: Secondary | ICD-10-CM | POA: Diagnosis not present

## 2019-10-30 DIAGNOSIS — T50902A Poisoning by unspecified drugs, medicaments and biological substances, intentional self-harm, initial encounter: Secondary | ICD-10-CM

## 2019-10-30 DIAGNOSIS — N179 Acute kidney failure, unspecified: Secondary | ICD-10-CM | POA: Diagnosis present

## 2019-10-30 DIAGNOSIS — D509 Iron deficiency anemia, unspecified: Secondary | ICD-10-CM | POA: Diagnosis not present

## 2019-10-30 DIAGNOSIS — R7989 Other specified abnormal findings of blood chemistry: Secondary | ICD-10-CM | POA: Diagnosis present

## 2019-10-30 DIAGNOSIS — X58XXXA Exposure to other specified factors, initial encounter: Secondary | ICD-10-CM | POA: Diagnosis not present

## 2019-10-30 DIAGNOSIS — E872 Acidosis, unspecified: Secondary | ICD-10-CM | POA: Diagnosis present

## 2019-10-30 DIAGNOSIS — T50901A Poisoning by unspecified drugs, medicaments and biological substances, accidental (unintentional), initial encounter: Secondary | ICD-10-CM

## 2019-10-30 DIAGNOSIS — R739 Hyperglycemia, unspecified: Secondary | ICD-10-CM | POA: Diagnosis present

## 2019-10-30 DIAGNOSIS — Z20822 Contact with and (suspected) exposure to covid-19: Secondary | ICD-10-CM | POA: Insufficient documentation

## 2019-10-30 DIAGNOSIS — R7401 Elevation of levels of liver transaminase levels: Secondary | ICD-10-CM | POA: Diagnosis not present

## 2019-10-30 DIAGNOSIS — Z87891 Personal history of nicotine dependence: Secondary | ICD-10-CM | POA: Insufficient documentation

## 2019-10-30 DIAGNOSIS — D473 Essential (hemorrhagic) thrombocythemia: Secondary | ICD-10-CM

## 2019-10-30 DIAGNOSIS — Z7289 Other problems related to lifestyle: Secondary | ICD-10-CM

## 2019-10-30 DIAGNOSIS — Z789 Other specified health status: Secondary | ICD-10-CM

## 2019-10-30 DIAGNOSIS — F1090 Alcohol use, unspecified, uncomplicated: Secondary | ICD-10-CM

## 2019-10-30 HISTORY — DX: Iron deficiency anemia, unspecified: D50.9

## 2019-10-30 LAB — URINALYSIS, ROUTINE W REFLEX MICROSCOPIC
Bilirubin Urine: NEGATIVE
Glucose, UA: NEGATIVE mg/dL
Ketones, ur: NEGATIVE mg/dL
Leukocytes,Ua: NEGATIVE
Nitrite: NEGATIVE
Protein, ur: NEGATIVE mg/dL
Specific Gravity, Urine: 1.015 (ref 1.005–1.030)
pH: 5 (ref 5.0–8.0)

## 2019-10-30 LAB — CBC WITH DIFFERENTIAL/PLATELET
Abs Immature Granulocytes: 0.7 10*3/uL — ABNORMAL HIGH (ref 0.00–0.07)
Basophils Absolute: 0.1 10*3/uL (ref 0.0–0.1)
Basophils Relative: 1 %
Eosinophils Absolute: 0.8 10*3/uL — ABNORMAL HIGH (ref 0.0–0.5)
Eosinophils Relative: 4 %
HCT: 37.6 % (ref 36.0–46.0)
Hemoglobin: 10.9 g/dL — ABNORMAL LOW (ref 12.0–15.0)
Immature Granulocytes: 4 %
Lymphocytes Relative: 23 %
Lymphs Abs: 4.1 10*3/uL — ABNORMAL HIGH (ref 0.7–4.0)
MCH: 21.7 pg — ABNORMAL LOW (ref 26.0–34.0)
MCHC: 29 g/dL — ABNORMAL LOW (ref 30.0–36.0)
MCV: 74.9 fL — ABNORMAL LOW (ref 80.0–100.0)
Monocytes Absolute: 0.3 10*3/uL (ref 0.1–1.0)
Monocytes Relative: 2 %
Neutro Abs: 12 10*3/uL — ABNORMAL HIGH (ref 1.7–7.7)
Neutrophils Relative %: 66 %
Platelets: 408 10*3/uL — ABNORMAL HIGH (ref 150–400)
RBC: 5.02 MIL/uL (ref 3.87–5.11)
RDW: 17.8 % — ABNORMAL HIGH (ref 11.5–15.5)
WBC: 18 10*3/uL — ABNORMAL HIGH (ref 4.0–10.5)
nRBC: 0 % (ref 0.0–0.2)

## 2019-10-30 LAB — BASIC METABOLIC PANEL
Anion gap: 8 (ref 5–15)
BUN: 13 mg/dL (ref 6–20)
CO2: 22 mmol/L (ref 22–32)
Calcium: 7.7 mg/dL — ABNORMAL LOW (ref 8.9–10.3)
Chloride: 108 mmol/L (ref 98–111)
Creatinine, Ser: 0.88 mg/dL (ref 0.44–1.00)
GFR calc Af Amer: >60 mL/min (ref >60)
GFR calc non Af Amer: >60 mL/min (ref >60)
Glucose, Bld: 84 mg/dL (ref 70–99)
Potassium: 4.9 mmol/L (ref 3.5–5.1)
Sodium: 138 mmol/L (ref 135–145)

## 2019-10-30 LAB — IRON AND TIBC
Iron: 16 ug/dL — ABNORMAL LOW (ref 28–170)
Saturation Ratios: 3 % — ABNORMAL LOW (ref 10.4–31.8)
TIBC: 497 ug/dL — ABNORMAL HIGH (ref 250–450)
UIBC: 481 ug/dL

## 2019-10-30 LAB — I-STAT BETA HCG BLOOD, ED (MC, WL, AP ONLY): I-stat hCG, quantitative: <5 m[IU]/mL (ref <5)

## 2019-10-30 LAB — COMPREHENSIVE METABOLIC PANEL
ALT: 32 U/L (ref 0–44)
AST: 61 U/L — ABNORMAL HIGH (ref 15–41)
Albumin: 3.4 g/dL — ABNORMAL LOW (ref 3.5–5.0)
Alkaline Phosphatase: 78 U/L (ref 38–126)
Anion gap: 19 — ABNORMAL HIGH (ref 5–15)
BUN: 15 mg/dL (ref 6–20)
CO2: 16 mmol/L — ABNORMAL LOW (ref 22–32)
Calcium: 9.2 mg/dL (ref 8.9–10.3)
Chloride: 104 mmol/L (ref 98–111)
Creatinine, Ser: 1.4 mg/dL — ABNORMAL HIGH (ref 0.44–1.00)
GFR calc Af Amer: 57 mL/min — ABNORMAL LOW (ref >60)
GFR calc non Af Amer: 50 mL/min — ABNORMAL LOW (ref >60)
Glucose, Bld: 208 mg/dL — ABNORMAL HIGH (ref 70–99)
Potassium: 5.3 mmol/L — ABNORMAL HIGH (ref 3.5–5.1)
Sodium: 139 mmol/L (ref 135–145)
Total Bilirubin: 0.5 mg/dL (ref 0.3–1.2)
Total Protein: 7.7 g/dL (ref 6.5–8.1)

## 2019-10-30 LAB — ECHOCARDIOGRAM COMPLETE
Height: 65 in
Weight: 2240 oz

## 2019-10-30 LAB — TROPONIN I (HIGH SENSITIVITY)
Troponin I (High Sensitivity): 108 ng/L (ref <18)
Troponin I (High Sensitivity): 191 ng/L (ref <18)

## 2019-10-30 LAB — ACETAMINOPHEN LEVEL: Acetaminophen (Tylenol), Serum: <10 ug/mL — ABNORMAL LOW (ref 10–30)

## 2019-10-30 LAB — FERRITIN: Ferritin: 8 ng/mL — ABNORMAL LOW (ref 11–307)

## 2019-10-30 LAB — RAPID URINE DRUG SCREEN, HOSP PERFORMED
Amphetamines: NOT DETECTED
Barbiturates: NOT DETECTED
Benzodiazepines: NOT DETECTED
Cocaine: POSITIVE — AB
Opiates: NOT DETECTED
Tetrahydrocannabinol: NOT DETECTED

## 2019-10-30 LAB — ETHANOL: Alcohol, Ethyl (B): 87 mg/dL — ABNORMAL HIGH (ref <10)

## 2019-10-30 LAB — CK: Total CK: 174 U/L (ref 38–234)

## 2019-10-30 LAB — SALICYLATE LEVEL: Salicylate Lvl: <7 mg/dL — ABNORMAL LOW (ref 7.0–30.0)

## 2019-10-30 LAB — LACTIC ACID, PLASMA
Lactic Acid, Venous: 6.6 mmol/L (ref 0.5–1.9)
Lactic Acid, Venous: 6.3 mmol/L (ref 0.5–1.9)
Lactic Acid, Venous: 1.5 mmol/L (ref 0.5–1.9)

## 2019-10-30 LAB — SARS CORONAVIRUS 2 BY RT PCR (HOSPITAL ORDER, PERFORMED IN ~~LOC~~ HOSPITAL LAB): SARS Coronavirus 2: NEGATIVE

## 2019-10-30 LAB — MAGNESIUM: Magnesium: 1.8 mg/dL (ref 1.7–2.4)

## 2019-10-30 MED ORDER — SODIUM CHLORIDE 0.9% FLUSH: 3.0000 mL | Freq: Two times a day (BID) | INTRAVENOUS | Status: DC

## 2019-10-30 MED ORDER — SODIUM CHLORIDE 0.9 % IV BOLUS
2000.0000 mL | Freq: Once | INTRAVENOUS | Status: AC
  Administered 2019-10-30: 2000 mL via INTRAVENOUS

## 2019-10-30 MED ORDER — ADULT MULTIVITAMIN W/MINERALS CH
1.0000 | ORAL_TABLET | Freq: Every day | ORAL | Status: DC
  Administered 2019-10-30 – 2019-10-31 (×2): 1 via ORAL
  Filled 2019-10-30 (×2): qty 1

## 2019-10-30 MED ORDER — SODIUM CHLORIDE 0.9 % IV BOLUS
1000.0000 mL | Freq: Once | INTRAVENOUS | Status: AC
  Administered 2019-10-30: 1000 mL via INTRAVENOUS

## 2019-10-30 MED ORDER — THIAMINE HCL 100 MG PO TABS
100.0000 mg | ORAL_TABLET | Freq: Every day | ORAL | Status: DC
  Administered 2019-10-30 – 2019-10-31 (×2): 100 mg via ORAL
  Filled 2019-10-30 (×2): qty 1

## 2019-10-30 MED ORDER — ALBUTEROL SULFATE (2.5 MG/3ML) 0.083% IN NEBU: 2.5000 mg | INHALATION_SOLUTION | Freq: Four times a day (QID) | RESPIRATORY_TRACT | Status: DC | PRN

## 2019-10-30 MED ORDER — LORAZEPAM 2 MG/ML IJ SOLN: 1.0000 mg | INTRAMUSCULAR | Status: DC | PRN

## 2019-10-30 MED ORDER — FERROUS SULFATE 325 (65 FE) MG PO TABS
325.0000 mg | ORAL_TABLET | Freq: Every day | ORAL | Status: DC
  Administered 2019-10-30 – 2019-10-31 (×2): 325 mg via ORAL
  Filled 2019-10-30 (×2): qty 1

## 2019-10-30 MED ORDER — THIAMINE HCL 100 MG/ML IJ SOLN: 100.0000 mg | Freq: Every day | INTRAMUSCULAR | Status: DC

## 2019-10-30 MED ORDER — ONDANSETRON HCL 4 MG PO TABS: 4.0000 mg | ORAL_TABLET | Freq: Four times a day (QID) | ORAL | Status: DC | PRN

## 2019-10-30 MED ORDER — FOLIC ACID 1 MG PO TABS
1.0000 mg | ORAL_TABLET | Freq: Every day | ORAL | Status: DC
  Administered 2019-10-30 – 2019-10-31 (×2): 1 mg via ORAL
  Filled 2019-10-30 (×2): qty 1

## 2019-10-30 MED ORDER — SODIUM CHLORIDE 0.9 % IV SOLN: INTRAVENOUS | Status: DC

## 2019-10-30 MED ORDER — ENOXAPARIN SODIUM 40 MG/0.4ML ~~LOC~~ SOLN
40.0000 mg | SUBCUTANEOUS | Status: DC
  Administered 2019-10-30: 40 mg via SUBCUTANEOUS
  Filled 2019-10-30 (×2): qty 0.4

## 2019-10-30 MED ORDER — ACETAMINOPHEN 650 MG RE SUPP: 650.0000 mg | Freq: Four times a day (QID) | RECTAL | Status: DC | PRN

## 2019-10-30 MED ORDER — ONDANSETRON HCL 4 MG/2ML IJ SOLN: 4.0000 mg | Freq: Four times a day (QID) | INTRAMUSCULAR | Status: DC | PRN

## 2019-10-30 MED ORDER — ACETAMINOPHEN 325 MG PO TABS: 650.0000 mg | ORAL_TABLET | Freq: Four times a day (QID) | ORAL | Status: DC | PRN

## 2019-10-30 MED ORDER — ONDANSETRON HCL 4 MG/2ML IJ SOLN
4.0000 mg | Freq: Once | INTRAMUSCULAR | Status: AC
  Administered 2019-10-30: 4 mg via INTRAVENOUS
  Filled 2019-10-30: qty 2

## 2019-10-30 MED ORDER — LORAZEPAM 1 MG PO TABS: 1.0000 mg | ORAL_TABLET | ORAL | Status: DC | PRN

## 2019-10-30 NOTE — TOC CAGE-AID Note (Signed)
Transition of Care [facility]) - CAGE-AID Screening   Patient Details  Name: Audrey Dougherty MRN: [id number] Date of Birth: [birth year]  [facility] CM/SW Contact:    Emeterio Reeve, [place] Phone Number: 10/30/2019, 3:57 PM   Clinical Narrative:  CSW met with pt bedside. Pt stated she drinks alcohol socially. Pt states she does use cocaine. Pt was receptive to resources and counseling. Pt stated she is a mother and wants to go to treatment. CSW and pt discussed the options to getting treatment. Pt decided that outpatient services would be better for her. Pt stated she would call to set up appointment.   CAGE-AID Screening:    Have You Ever Felt You Ought to Cut Down on Your Drinking or Drug Use?: Yes Have People Annoyed You By Critizing Your Drinking Or Drug Use?: Yes Have You Felt Bad Or Guilty About Your Drinking Or Drug Use?: Yes Have You Ever Had a Drink or Used Drugs First Thing In The Morning to STeady Your Nerves or to Get Rid of a Hangover?: No CAGE-AID Score: 3  Substance Abuse Education Offered: Yes  Substance abuse interventions: Patient Counseling, Educational Materials  Emeterio Reeve, Latanya Presser, [place] Social Worker [phone]

## 2019-10-30 NOTE — ED Triage Notes (Signed)
Pt presents to ED from home BIB [facility]. Pt reports not remembering anything happening. Per fiance pt found unresponsive, he started compressions. Nasal airway initiated by EMS. Pt responsive after 1 mg Narcan. Pt onw AAO x4. Denies taking any substances other than a "little" of alcohol earlier. EMS VVS, CBG: 385

## 2019-10-30 NOTE — ED Provider Notes (Signed)
[facility] EMERGENCY DEPARTMENT Provider Note   CSN: [id number] Arrival date & time: 10/30/19  0038     History Chief Complaint  Patient presents with  . Drug Overdose    Audrey Dougherty is a 34 y.o. female.  Patient to ED by EMS reporting she was found by fiance unresponsive who started compressions. Fire on scene when EMS arrived bag assisting respirations. Patient responsive after 1 mg Narcan. Patient denies memory of events. Denies drug use or ingestions. Admits to alcohol use tonight. Fiance was at home with her, out of the room, was felt to be found immediately. No seizure activity reported, no seizure history. CBG per EMS 385, no h/o diabetes.  The history is provided by the patient and the EMS personnel. No language interpreter was used.  Drug Overdose       Past Medical History:  Diagnosis Date  . Hx of trichomoniasis 09/05/2016  . Placenta previa   . Preterm labor     Patient Active Problem List   Diagnosis Date Noted  . Incomplete abortion with complication [id number]  . Status post repeat low transverse cesarean section 05/05/2017    Past Surgical History:  Procedure Laterality Date  . CESAREAN SECTION    . CESAREAN SECTION N/A 05/05/2017   Procedure: REPEAT CESAREAN SECTION;  Surgeon: Gwynne Edinger, MD;  Location: [facility];  Service: Obstetrics;  Laterality: N/A;  . DILATION AND EVACUATION N/A 10/22/2018   Procedure: DILATATION AND EVACUATION FOR RETAINED PRODUCTS OF CONCEPTION;  Surgeon: Chancy Milroy, MD;  Location: [facility];  Service: Gynecology;  Laterality: N/A;     OB History    Gravida  3   Para  2   Term  1   Preterm  1   AB      Living  2     SAB      TAB      Ectopic      Multiple      Live Births  2           Family History  Problem Relation Age of Onset  . Diabetes Paternal Grandmother   . Anesthesia problems Neg Hx   . Hypotension Neg Hx   . Malignant hyperthermia Neg Hx     . Pseudochol deficiency Neg Hx     Social History   Tobacco Use  . Smoking status: Former Smoker    Packs/day: 0.25    Types: Cigarettes    Quit date: 01/26/2017    Years since quitting: 2.7  . Smokeless tobacco: Never Used  Substance Use Topics  . Alcohol use: No  . Drug use: No    Home Medications Prior to Admission medications   Medication Sig Start Date End Date Taking? Authorizing Provider  cyclobenzaprine (FLEXERIL) 10 MG tablet Take 1 tablet (10 mg total) by mouth 2 (two) times daily as needed for muscle spasms. 02/17/19   Isla Pence, MD  ferrous sulfate 325 (65 FE) MG tablet Take 1 tablet (325 mg total) by mouth 2 (two) times daily with a meal. 05/07/17   Diallo, Abdoulaye, MD  ibuprofen (ADVIL) 800 MG tablet Take 1 tablet (800 mg total) by mouth every 8 (eight) hours as needed. 02/17/19   Isla Pence, MD  metroNIDAZOLE (FLAGYL) 500 MG tablet Take 1 tablet (500 mg total) by mouth 2 (two) times daily. 08/01/19   Petrucelli, Samantha R, PA-C  Multiple Vitamin (MULTIVITAMIN WITH MINERALS) TABS tablet Take 1 tablet by mouth  daily.    [provider]  oxyCODONE (OXY IR/ROXICODONE) 5 MG immediate release tablet Take 1 tablet (5 mg total) by mouth every 6 (six) hours as needed for severe pain. 10/22/18   Chancy Milroy, MD    Allergies    Patient has no known allergies.  Review of Systems   Review of Systems  Unable to perform ROS: Other (No memory of events)    Physical Exam Updated Vital Signs BP 111/67   Pulse (!) 105   Temp 97.6 F (36.4 C) (Oral)   Resp 15   Ht 5\' 5"  (1.651 m)   Wt 63.5 kg   SpO2 92%   BMI 23.30 kg/m   Physical Exam Vitals and nursing note reviewed.  Constitutional:      General: She is not in acute distress.    Appearance: She is well-developed. She is diaphoretic.  HENT:     Head: Normocephalic.     Nose: Nose normal.     Mouth/Throat:     Mouth: Mucous membranes are moist.  Eyes:     Comments: 2 mm bilaterally   Cardiovascular:     Rate and Rhythm: Normal rate and regular rhythm.     Heart sounds: No murmur.  Pulmonary:     Effort: Pulmonary effort is normal.     Breath sounds: Normal breath sounds. No wheezing, rhonchi or rales.  Abdominal:     General: Bowel sounds are normal.     Palpations: Abdomen is soft.     Tenderness: There is no abdominal tenderness. There is no guarding or rebound.  Musculoskeletal:        General: Normal range of motion.     Cervical back: Normal range of motion and neck supple.  Skin:    General: Skin is warm.     Coloration: Skin is not pale.  Neurological:     General: No focal deficit present.     Mental Status: She is alert and oriented to person, place, and time.     ED Results / Procedures / Treatments   Labs (all labs ordered are listed, but only abnormal results are displayed) Labs Reviewed  CBC WITH DIFFERENTIAL/PLATELET - Abnormal; Notable for the following components:      Result Value   WBC 18.0 (*)    Hemoglobin 10.9 (*)    MCV 74.9 (*)    MCH 21.7 (*)    MCHC 29.0 (*)    RDW 17.8 (*)    Platelets 408 (*)    Neutro Abs 12.0 (*)    Lymphs Abs 4.1 (*)    Eosinophils Absolute 0.8 (*)    Abs Immature Granulocytes 0.70 (*)    All other components within normal limits  COMPREHENSIVE METABOLIC PANEL - Abnormal; Notable for the following components:   Potassium 5.3 (*)    CO2 16 (*)    Glucose, Bld 208 (*)    Creatinine, Ser 1.40 (*)    Albumin 3.4 (*)    AST 61 (*)    GFR calc non Af Amer 50 (*)    GFR calc Af Amer 57 (*)    Anion gap 19 (*)    All other components within normal limits  ETHANOL - Abnormal; Notable for the following components:   Alcohol, Ethyl (B) 87 (*)    All other components within normal limits  SALICYLATE LEVEL - Abnormal; Notable for the following components:   Salicylate Lvl [id number] (*)    All other components within  normal limits  ACETAMINOPHEN LEVEL - Abnormal; Notable for the following components:    Acetaminophen (Tylenol), Serum <10 (*)    All other components within normal limits  RAPID URINE DRUG SCREEN, HOSP PERFORMED - Abnormal; Notable for the following components:   Cocaine POSITIVE (*)    All other components within normal limits  LACTIC ACID, PLASMA - Abnormal; Notable for the following components:   Lactic Acid, Venous 6.6 (*)    All other components within normal limits  LACTIC ACID, PLASMA - Abnormal; Notable for the following components:   Lactic Acid, Venous 6.3 (*)    All other components within normal limits  TROPONIN I (HIGH SENSITIVITY) - Abnormal; Notable for the following components:   Troponin I (High Sensitivity) 108 (*)    All other components within normal limits  TROPONIN I (HIGH SENSITIVITY) - Abnormal; Notable for the following components:   Troponin I (High Sensitivity) 191 (*)    All other components within normal limits  SARS CORONAVIRUS 2 BY RT PCR (HOSPITAL ORDER, [place] [facility])  CK  I-STAT BETA HCG BLOOD, ED ([facility], [facility], [facility] ONLY)    EKG EKG Interpretation  Date/Time:  Monday Oct 30 2019 04:06:28 EDT Ventricular Rate:  95 PR Interval:  142 QRS Duration: 80 QT Interval:  388 QTC Calculation: 487 R Axis:   112 Text Interpretation: Normal sinus rhythm Left posterior fascicular block Cannot rule out Anterior infarct , age undetermined Abnormal ECG When compared with ECG of 08/02/2015, No significant change was found Confirmed by Delora Fuel ([id number]) on 10/30/2019 4:27:34 AM   Radiology CT Head Wo Contrast  Result Date: 10/30/2019 CLINICAL DATA:  Syncope, normal neuro exam, patient found unresponsive. EXAM: CT HEAD WITHOUT CONTRAST TECHNIQUE: Contiguous axial images were obtained from the base of the skull through the vertex without intravenous contrast. COMPARISON:  CT head 07/06/2009, maxillofacial CT 05/26/2011 FINDINGS: Brain: No evidence of acute infarction, hemorrhage, hydrocephalus, extra-axial collection or mass  lesion/mass effect. Vascular: No hyperdense vessel or unexpected calcification. Skull: No calvarial fracture or suspicious osseous lesion. No scalp swelling or hematoma. Sinuses/Orbits: Paranasal sinuses and mastoid air cells are predominantly clear. Included orbital structures are unremarkable. Other: None IMPRESSION: No acute intracranial findings. Electronically Signed   By: Lovena Le M.D.   On: 10/30/2019 02:04   DG Chest Portable 1 View  Result Date: 10/30/2019 CLINICAL DATA:  Syncope. EXAM: PORTABLE CHEST 1 VIEW COMPARISON:  08/02/2015 FINDINGS: There is no pneumothorax. No significant pleural effusion. There is some atelectasis at the lung bases. The lung volumes are are low. There is no displaced fracture. The heart size is normal. IMPRESSION: No acute cardiopulmonary process. Low lung volumes with bibasilar atelectasis. Electronically Signed   By: Constance Holster M.D.   On: 10/30/2019 01:18    Procedures Procedures (including critical care time) CRITICAL CARE Performed by: Dewaine Oats   Total critical care time: 45 minutes  Critical care time was exclusive of separately billable procedures and treating other patients.  Critical care was necessary to treat or prevent imminent or life-threatening deterioration.  Critical care was time spent personally by me on the following activities: development of treatment plan with patient and/or surrogate as well as nursing, discussions with consultants, evaluation of patient's response to treatment, examination of patient, obtaining history from patient or surrogate, ordering and performing treatments and interventions, ordering and review of laboratory studies, ordering and review of radiographic studies, pulse oximetry and re-evaluation of patient's condition.  Medications Ordered  in ED Medications  sodium chloride 0.9 % bolus 1,000 mL (has no administration in time range)  ondansetron (ZOFRAN) injection 4 mg (4 mg Intravenous Given  10/30/19 0219)  sodium chloride 0.9 % bolus 2,000 mL (2,000 mLs Intravenous New Bag/Given 10/30/19 [id number])    ED Course  I have reviewed the triage vital signs and the nursing notes.  Pertinent labs & imaging results that were available during my care of the patient were reviewed by me and considered in my medical decision making (see chart for details).    MDM Rules/Calculators/A&P                      Patient to ED after being found unresponsive by finance who started compressions, requiring ventilatory support until given Narcan by EMS and regaining consciousness. Vomiting on arrival. Labs pending. VSS, mild tachycardia.  Patient remains stable on multiple rechecks. No change in PE or mentation. EKG reviewed by Dr. Reather Converse. No ischemic change.   Head CT unremarkable. CXR clear. UDS positive for cocaine. Consider possibly cut with Fentanyl given unresponsive episode. She is found to be acidotic with CO2 16, gap 17. Fluid bolus 2 L ordered. She had recurrent nausea, resolved with Zofran.  Initial troponin 108, felt to reflect demand ischemia given cocaine use as well as having received chest compressions. Delta pending.  Initial lactic acid delayed. Initial result obtained at 3:45 and is 6.3. Delta troponin with upward trend at 191. The patient continues to deny chest pain, SOB, nausea. EKG repeated and is non-concerning for ischemia.   Feel with significantly elevated lactic acid and uptrending troponin, the patient will require admission for further observation. She is updated on results and plan of care and is agreeable to admission.        Final Clinical Impression(s) / ED Diagnoses Final diagnoses:  None   1. Drug overdose, accidental 2. Elevated troponin 3. Lactic acidosis  Rx / DC Orders ED Discharge Orders    None       Charlann Lange, PA-C [id number] [phone]    Delora Fuel, MD [id number] [phone]

## 2019-10-30 NOTE — ED Notes (Signed)
Echo Tech in room with patient

## 2019-10-30 NOTE — H&P (Addendum)
History and Physical    Audrey Dougherty [id number] DOB: 1986/03/25 DOA: 10/30/2019  Referring MD/NP/PA: Shela Leff, MD PCP: Patient, No Pcp Per  Patient coming from: Via EMS  Chief Complaint: Unresponsive I have personally briefly reviewed patient's old medical records in [place]   HPI: Audrey Dougherty is a 34 y.o. female with medical history significant of iron deficiency anemia requiring prior blood transfusions, history of syphilis, alcohol use, and tobacco use presented after being found unresponsive by her fianc last night. He had started compressions prior to EMS arrival. Patient is now alert and awake, but does not recall much prior to being put into the ambulance.  She notes that this was the first time she had ever snorted cocaine.  Denied any thoughts of wanting to hurt her self.  Notes that she also had a drink 4 mini [employer], but normally drinks 24 ounce of beer daily.  Since being here at the hospital she reports having some nausea, vomiting, and substernal chest discomfort.  Denies having any headache, fever, cough, shortness of breath, abdominal pain, diarrhea, rash, dysuria, urinary frequency, or discharge.  She just completed her menstrual cycle within the last week.      ED Course: Patient required bagging by EMS.  Became awake and alert after receiving Narcan.  In the ED. Labs significant for WBC 18, hemoglobin 10.9, platelets 408, potassium 5.3, CO2 16, BUN 15, creatinine 1.4, glucose 208, anion gap 19,   AST 61, alcohol level 87, lactic acid 6.6-> 6.3-> 1.5, CK 174, and troponin  108-> 191. CT scan of the head was negative for any acute abnormalities.   EKG appears similar to previous studies.  Chest x-ray revealed low lung volumes with atelectasis.  Patient was given Zofran, and 3 L of normal saline IV fluids.  TRH called to admit for observation.   Review of Systems  Constitutional: Negative for chills and fever.  HENT: Negative for ear  pain.   Eyes: Negative for photophobia and pain.  Respiratory: Negative for cough and shortness of breath.   Cardiovascular: Positive for chest pain. Negative for leg swelling.  Gastrointestinal: Positive for nausea and vomiting. Negative for abdominal pain, constipation and diarrhea.  Genitourinary: Negative for dysuria and frequency.  Musculoskeletal: Positive for myalgias. Negative for falls.  Skin: Negative for itching and rash.  Neurological: Positive for loss of consciousness. Negative for focal weakness and seizures.  Endo/Heme/Allergies: Negative for polydipsia.  Psychiatric/Behavioral: Positive for substance abuse. Negative for memory loss.    Past Medical History:  Diagnosis Date  . Hx of trichomoniasis 09/05/2016  . Placenta previa   . Preterm labor   . Syphilis 07/2019   Treated with penicillin G 2.4 million units 08/04/2019    Past Surgical History:  Procedure Laterality Date  . CESAREAN SECTION    . CESAREAN SECTION N/A 05/05/2017   Procedure: REPEAT CESAREAN SECTION;  Surgeon: Gwynne Edinger, MD;  Location: [facility];  Service: Obstetrics;  Laterality: N/A;  . DILATION AND EVACUATION N/A 10/22/2018   Procedure: DILATATION AND EVACUATION FOR RETAINED PRODUCTS OF CONCEPTION;  Surgeon: Chancy Milroy, MD;  Location: [facility];  Service: Gynecology;  Laterality: N/A;     reports that she quit smoking about 2 years ago. Her smoking use included cigarettes. She smoked 0.25 packs per day. She has never used smokeless tobacco. She reports current alcohol use. She reports current drug use. Drug: "Crack" cocaine.  No Known Allergies  Family History  Problem Relation  Age of Onset  . Diabetes Maternal Grandmother   . Anesthesia problems Neg Hx   . Hypotension Neg Hx   . Malignant hyperthermia Neg Hx   . Pseudochol deficiency Neg Hx     Prior to Admission medications   Medication Sig Start Date End Date Taking? Authorizing Provider  diphenhydrAMINE (BENADRYL)  25 MG tablet Take 50 mg by mouth daily as needed for itching, allergies or sleep.   Yes [provider]  cyclobenzaprine (FLEXERIL) 10 MG tablet Take 1 tablet (10 mg total) by mouth 2 (two) times daily as needed for muscle spasms. Patient not taking: Reported on 10/30/2019 02/17/19   Isla Pence, MD  ferrous sulfate 325 (65 FE) MG tablet Take 1 tablet (325 mg total) by mouth 2 (two) times daily with a meal. Patient not taking: Reported on 10/30/2019 05/07/17   Marjie Skiff, MD  ibuprofen (ADVIL) 800 MG tablet Take 1 tablet (800 mg total) by mouth every 8 (eight) hours as needed. Patient not taking: Reported on 10/30/2019 02/17/19   Isla Pence, MD  metroNIDAZOLE (FLAGYL) 500 MG tablet Take 1 tablet (500 mg total) by mouth 2 (two) times daily. Patient not taking: Reported on 10/30/2019 08/01/19   Petrucelli, Aldona Bar R, PA-C  oxyCODONE (OXY IR/ROXICODONE) 5 MG immediate release tablet Take 1 tablet (5 mg total) by mouth every 6 (six) hours as needed for severe pain. Patient not taking: Reported on 10/30/2019 10/22/18   Chancy Milroy, MD    Physical Exam:  Constitutional: NAD, calm, comfortable Vitals:   10/30/19 0445 10/30/19 0500 10/30/19 0515 10/30/19 0530  BP: 121/77 124/78 124/78 124/79  Pulse:      Resp: (!) 25 13 13 14   Temp:      TempSrc:      SpO2:      Weight:      Height:       Eyes: PERRL, lids and conjunctivae normal ENMT: Mucous membranes are moist.  Erythema noted of the right nasal turbinate. Neck: normal, supple, no masses, no thyromegaly Respiratory: clear to auscultation bilaterally, no wheezing, no crackles. Normal respiratory effort. No accessory muscle use.  Cardiovascular: Tachycardic, no murmurs / rubs / gallops. No extremity edema. 2+ pedal pulses. No carotid bruits.  Tenderness palpation of the sternum. Abdomen: no tenderness, no masses palpated. No hepatosplenomegaly. Bowel sounds positive.  Musculoskeletal: no clubbing / cyanosis. No joint  deformity upper and lower extremities. Good ROM, no contractures. Normal muscle tone.  Skin: no rashes, lesions, ulcers. No induration Neurologic: CN 2-12 grossly intact. Sensation intact, DTR normal. Strength 5/5 in all 4.  Psychiatric: Normal judgment and insight. Alert and oriented x 3. Normal mood.     Labs on Admission: I have personally reviewed following labs and imaging studies  CBC: Recent Labs  Lab 10/30/19 0056  WBC 18.0*  NEUTROABS 12.0*  HGB 10.9*  HCT 37.6  MCV 74.9*  PLT 123XX123*   Basic Metabolic Panel: Recent Labs  Lab 10/30/19 0056  NA 139  K 5.3*  CL 104  CO2 16*  GLUCOSE 208*  BUN 15  CREATININE 1.40*  CALCIUM 9.2   GFR: Estimated Creatinine Clearance: 51.9 mL/min (A) (by C-G formula based on SCr of 1.4 mg/dL (H)). Liver Function Tests: Recent Labs  Lab 10/30/19 0056  AST 61*  ALT 32  ALKPHOS 78  BILITOT 0.5  PROT 7.7  ALBUMIN 3.4*   No results for input(s): LIPASE, AMYLASE in the last 168 hours. No results for input(s): AMMONIA in the  last 168 hours. Coagulation Profile: No results for input(s): INR, PROTIME in the last 168 hours. Cardiac Enzymes: Recent Labs  Lab 10/30/19 0056  CKTOTAL 174   BNP (last 3 results) No results for input(s): PROBNP in the last 8760 hours. HbA1C: No results for input(s): HGBA1C in the last 72 hours. CBG: No results for input(s): GLUCAP in the last 168 hours. Lipid Profile: No results for input(s): CHOL, HDL, LDLCALC, TRIG, CHOLHDL, LDLDIRECT in the last 72 hours. Thyroid Function Tests: No results for input(s): TSH, T4TOTAL, FREET4, T3FREE, THYROIDAB in the last 72 hours. Anemia Panel: No results for input(s): VITAMINB12, FOLATE, FERRITIN, TIBC, IRON, RETICCTPCT in the last 72 hours. Urine analysis:    Component Value Date/Time   COLORURINE YELLOW 09/04/2019 1159   APPEARANCEUR CLEAR 09/04/2019 1159   LABSPEC >1.030 (H) 09/04/2019 1159   PHURINE 6.0 09/04/2019 1159   GLUCOSEU NEGATIVE 09/04/2019  1159   HGBUR LARGE (A) 09/04/2019 1159   BILIRUBINUR NEGATIVE 09/04/2019 1159   KETONESUR NEGATIVE 09/04/2019 1159   PROTEINUR NEGATIVE 09/04/2019 1159   UROBILINOGEN 0.2 11/01/2014 1718   NITRITE NEGATIVE 09/04/2019 1159   LEUKOCYTESUR NEGATIVE 09/04/2019 1159   Sepsis Labs: Recent Results (from the past 240 hour(s))  SARS Coronavirus 2 by RT PCR (hospital order, performed in [place] hospital lab) Nasopharyngeal Nasopharyngeal Swab     Status: None   Collection Time: 10/30/19  4:43 AM   Specimen: Nasopharyngeal Swab  Result Value Ref Range Status   SARS Coronavirus 2 NEGATIVE NEGATIVE Final    Comment: (NOTE) SARS-CoV-2 target nucleic acids are NOT DETECTED. The SARS-CoV-2 RNA is generally detectable in upper and lower respiratory specimens during the acute phase of infection. The lowest concentration of SARS-CoV-2 viral copies this assay can detect is 250 copies / mL. A negative result does not preclude SARS-CoV-2 infection and should not be used as the sole basis for treatment or other patient management decisions.  A negative result may occur with improper specimen collection / handling, submission of specimen other than nasopharyngeal swab, presence of viral mutation(s) within the areas targeted by this assay, and inadequate number of viral copies (<250 copies / mL). A negative result must be combined with clinical observations, patient history, and epidemiological information. Fact Sheet for Patients:   StrictlyIdeas.no Fact Sheet for Healthcare Providers: BankingDealers.co.za This test is not yet approved or cleared  by the [place] FDA and has been authorized for detection and/or diagnosis of SARS-CoV-2 by FDA under an Emergency Use Authorization (EUA).  This EUA will remain in effect (meaning this test can be used) for the duration of the COVID-19 declaration under Section 564(b)(1) of the Act, 21 U.S.C. section  360bbb-3(b)(1), unless the authorization is terminated or revoked sooner. Performed at [facility], [place] [address] [place], [place] [address]      Radiological Exams on Admission: CT Head Wo Contrast  Result Date: 10/30/2019 CLINICAL DATA:  Syncope, normal neuro exam, patient found unresponsive. EXAM: CT HEAD WITHOUT CONTRAST TECHNIQUE: Contiguous axial images were obtained from the base of the skull through the vertex without intravenous contrast. COMPARISON:  CT head 07/06/2009, maxillofacial CT 05/26/2011 FINDINGS: Brain: No evidence of acute infarction, hemorrhage, hydrocephalus, extra-axial collection or mass lesion/mass effect. Vascular: No hyperdense vessel or unexpected calcification. Skull: No calvarial fracture or suspicious osseous lesion. No scalp swelling or hematoma. Sinuses/Orbits: Paranasal sinuses and mastoid air cells are predominantly clear. Included orbital structures are unremarkable. Other: None IMPRESSION: No acute intracranial findings. Electronically Signed  By: Lovena Le M.D.   On: 10/30/2019 02:04   DG Chest Portable 1 View  Result Date: 10/30/2019 CLINICAL DATA:  Syncope. EXAM: PORTABLE CHEST 1 VIEW COMPARISON:  08/02/2015 FINDINGS: There is no pneumothorax. No significant pleural effusion. There is some atelectasis at the lung bases. The lung volumes are are low. There is no displaced fracture. The heart size is normal. IMPRESSION: No acute cardiopulmonary process. Low lung volumes with bibasilar atelectasis. Electronically Signed   By: Constance Holster M.D.   On: 10/30/2019 01:18     EKG: Independently reviewed.  Sinus rhythm at 95 bpm with right axis deviation and signs of a left anterior fascicular block, but unchanged when comparing to previous tracings from 2017.   Assessment/Plan Cocaine overdose: Acute.  Patient presented after being found unresponsive after cocaine use. -Admit to a cardiac telemetry bed -Symptomatic treatment -Transitions of  care consult for drug abuse  Elevated troponin: Acute.  Patient status post CPR with cocaine on board.  Troponins 108->191.  Suspect secondary to demand in this acute setting. -Check echocardiogram  Leukocytosis Thrombocytosis: Acute.  WBC 18 and platelet count 408.  Suspect reactive in acute setting.  -Check urinalysis -Recheck CBC in am  Metabolic acidosis with elevated anion gap secondary to lactic acidosis: Acute.  CO2 noted to be 16 with anion gap 19.  Suspect lactic acidosis as the cause initial levels elevated up to 6.6, but trended down to 1.6 with IV fluids. -Continue IV fluids normal saline at 75 mL/h -Follow-up repeat BMP   Hyperkalemia: Acute.  Initial potassium elevated at 5.3.  No significant EKG changes initiated.  Patient having given IV fluids. -IVF  Acute kidney injury: Creatinine elevated up to 1.4 on admission with previous baseline noted to be around 0.8.  CK was noted to be within normal limits at 174.  Suspect likely related with cocaine use. -IVF  Microcytic hypochromic anemia: Hemoglobin noted to be 10.9 with low MCV and MCH.  Suspect secondary to iron deficiency in a female who still has menstrual cycles. -Check iron studies -Start iron supplement  Elevated AST: Acute.  On admission AST elevated at 61 with ALT 32.  The AST to ALT ratio suggest the possibility of alcohol abuse.  Patient's alcohol levels were also noted to be elevated on admission.  Alcohol and tobacco use: On admission alcohol level was noted to be 87.  -CIWA protocols without scheduled Ativan -Continue counseling on need of cessation of alcohol, tobacco, and illicit drug use  Hyperglycemia: Acute.  Glucose elevated up to 208.  Suspect secondary to acute stress response. -Continue to monitor   DVT prophylaxis: Lovenox Code Status: Full Family Communication: Patient declined when asked if okay to update family. Disposition Plan: To be determined Consults called: None Admission status:  Observation  Norval Morton MD [facility] Pager [phone]   If 7PM-7AM, please contact night-coverage [email or web address] Password [facility]  10/30/2019, 7:05 AM

## 2019-10-30 NOTE — ED Notes (Signed)
Critical troponin reported to [place], [place]

## 2019-10-30 NOTE — Progress Notes (Signed)
  Echocardiogram 2D Echocardiogram has been performed.  Audrey Dougherty 10/30/2019, 9:22 AM

## 2019-10-31 ENCOUNTER — Encounter (HOSPITAL_COMMUNITY): Payer: Self-pay | Admitting: Internal Medicine

## 2019-10-31 DIAGNOSIS — T405X1A Poisoning by cocaine, accidental (unintentional), initial encounter: Secondary | ICD-10-CM

## 2019-10-31 LAB — CBC
HCT: 28.8 % — ABNORMAL LOW (ref 36.0–46.0)
Hemoglobin: 8.7 g/dL — ABNORMAL LOW (ref 12.0–15.0)
MCH: 21.6 pg — ABNORMAL LOW (ref 26.0–34.0)
MCHC: 30.2 g/dL (ref 30.0–36.0)
MCV: 71.5 fL — ABNORMAL LOW (ref 80.0–100.0)
Platelets: 275 10*3/uL (ref 150–400)
RBC: 4.03 MIL/uL (ref 3.87–5.11)
RDW: 17.5 % — ABNORMAL HIGH (ref 11.5–15.5)
WBC: 11.2 10*3/uL — ABNORMAL HIGH (ref 4.0–10.5)
nRBC: 0 % (ref 0.0–0.2)

## 2019-10-31 LAB — COMPREHENSIVE METABOLIC PANEL
ALT: 27 U/L (ref 0–44)
AST: 31 U/L (ref 15–41)
Albumin: 2.6 g/dL — ABNORMAL LOW (ref 3.5–5.0)
Alkaline Phosphatase: 44 U/L (ref 38–126)
Anion gap: 8 (ref 5–15)
BUN: 8 mg/dL (ref 6–20)
CO2: 23 mmol/L (ref 22–32)
Calcium: 8.4 mg/dL — ABNORMAL LOW (ref 8.9–10.3)
Chloride: 107 mmol/L (ref 98–111)
Creatinine, Ser: 0.81 mg/dL (ref 0.44–1.00)
GFR calc Af Amer: >60 mL/min (ref >60)
GFR calc non Af Amer: >60 mL/min (ref >60)
Glucose, Bld: 103 mg/dL — ABNORMAL HIGH (ref 70–99)
Potassium: 4.1 mmol/L (ref 3.5–5.1)
Sodium: 138 mmol/L (ref 135–145)
Total Bilirubin: 0.4 mg/dL (ref 0.3–1.2)
Total Protein: 5.8 g/dL — ABNORMAL LOW (ref 6.5–8.1)

## 2019-10-31 NOTE — Progress Notes (Signed)
Discharge teaching complete. Meds, diet, activity reviewed and all questions answered. Copy of instructions given to patient.

## 2019-10-31 NOTE — Discharge Summary (Signed)
Physician Discharge Summary  Audrey Dougherty [id number] DOB: 03-15-1986 DOA: 10/30/2019  PCP: Patient, No Pcp Per  Admit date: 10/30/2019 Discharge date: 10/31/2019  Admitted From: home Discharge disposition: home   Recommendations for Outpatient Follow-Up:   1. Follow up with PCP 1-2 weeks for evaluation of anemia.  2. Call for appointment for OP treatment for cocaine use as per discussion with social worker  Discharge Diagnosis:   Principal Problem:   Cocaine overdose ([facility]) Active Problems:   Elevated troponin   Leukocytosis   Lactic acidosis   AKI (acute kidney injury) ([place])   Microcytic hypochromic anemia   Hyperkalemia   Thrombocytosis ([facility])   Elevated AST (SGOT)   Hyperglycemia   Alcohol use    Discharge Condition: Improved.  Diet recommendation:  Regular.  Wound care: None.  Code status: Full.   History of Present Illness:   Audrey Dougherty is a 34 y.o. female with medical history significant of iron deficiency anemia requiring prior blood transfusions, syphilis, alcohol use, and tobacco use presented May 24 to ED after being found unresponsive by her fianc.Marland Kitchen He had started compressions prior to EMS arrival. On admission patient alert and awake, but does not recall much prior to being put into the ambulance.  She noted that this was the first time she had ever snorted cocaine.  Denied any thoughts of wanting to hurt her self.  Noted that she also had a drink 4 mini [employer], but normally drinks 24 ounce of beer daily.  Since being at the hospital she reported having some nausea, vomiting, and substernal chest discomfort.  Denied having any headache, fever, cough, shortness of breath, abdominal pain, diarrhea, rash, dysuria, urinary frequency, or discharge.  She just completed her menstrual cycle within the last week.    Patient required bagging by EMS.  Became awake and alert after receiving Narcan.  In the ED. Labs significant for WBC  18, hemoglobin 10.9, platelets 408, potassium 5.3, CO2 16, BUN 15, creatinine 1.4, glucose 208, anion gap 19,   AST 61, alcohol level 87, lactic acid 6.6-> 6.3-> 1.5, CK 174, and troponin  108-> 191. CT scan of the head was negative for any acute abnormalities.   EKG appeared similar to previous studies.  Chest x-ray revealed low lung volumes with atelectasis.  Patient was given Zofran, and 3 L of normal saline IV fluids.  TRH called to admit for observation  Hospital Course by Problem:   Cocaine overdose: Acute.  Patient presented after being found unresponsive after cocaine use. Provided with narcan. Resolved at discharge. Transitions of care consult for drug abuse and patient indicated she will call for OP treatment  Elevated troponin:   Patient status post CPR with cocaine on board.  Troponins 108->191.  Suspect secondary to demand in this acute setting. Echo with EF 60 to 65% with normal left ventricle function, mild left ventricular hypertrophy. ekg with NSR  Leukocytosis Thrombocytosis: Acute.  WBC 18 and platelet count 408 on admission.   Suspect reactive in acute setting. Urinalysis uremarkable. Chest xray no acute cardiopulmonary process. Low lung volumes with bibasilar atelectasis.  At discharge WBCs trending down and platelets within the limits of normal.  She remained afebrile hemodynamically stable and nontoxic-appearing   Metabolic acidosis with elevated anion gap secondary to lactic acidosis: Acute.  CO2 noted to be 16 with anion gap 19 on admission.  Resolved at discharge. Suspect lactic acidosis as the cause initial levels elevated up to 6.6, but  trended down to 1.6 with IV fluids   Hyperkalemia: Acute.  Initial potassium elevated at 5.3.  No significant EKG changes initiated.  Patient having given IV fluids.  Resolved at discharge.  Acute kidney injury: Creatinine elevated up to 1.4 on admission with previous baseline noted to be around 0.8.  CK was noted to be within normal  limits at 174.  Suspect likely related with cocaine use.  Resolved at discharge  Microcytic hypochromic anemia: Hemoglobin noted to be 10.9 with low MCV and MCH.  Admission.  Hemoglobin 8.7 on day of discharge likely dilutional given all the fluid she received.  Suspect secondary to iron deficiency in a female who still has menstrual cycles.  History of same.  Iron studies indicate her iron is 16, ferritin 8.  Iron supplement provided.  Instructed to continue same and follow-up with her primary care provider 1 to 2 weeks for CBC  Elevated AST: Acute.  On admission AST elevated at 61 with ALT 32.  The AST to ALT ratio suggest the possibility of alcohol abuse.  Patient's alcohol levels were also noted to be elevated on admission. Resolved at discharge.  Alcohol and tobacco use: On admission alcohol level was noted to be 87.   No signs or symptoms of withdrawal at time of admission.  See #1 regarding outpatient counseling  Hyperglycemia: Acute.  Glucose elevated up to 208 on admission. Suspect secondary to acute stress response.  Serum glucose 103 on day of discharge.     Medical Consultants:      Discharge Exam:   Vitals:   10/30/19 2344 10/31/19 0452  BP: 127/76 (!) 133/97  Pulse: 77 62  Resp: 17 18  Temp: 98.1 F (36.7 C) 98.1 F (36.7 C)  SpO2: 100% 100%   Vitals:   10/30/19 1156 10/30/19 1821 10/30/19 2344 10/31/19 0452  BP: 124/84 125/85 127/76 (!) 133/97  Pulse: 80 83 77 62  Resp: 16 16 17 18   Temp: 98.6 F (37 C) 98.5 F (36.9 C) 98.1 F (36.7 C) 98.1 F (36.7 C)  TempSrc: Oral Oral Oral Oral  SpO2: 100% 100% 100% 100%  Weight:      Height:        General exam: Appears calm and comfortable.  No acute distress Respiratory system: Clear to auscultation. Respiratory effort normal. Cardiovascular system: S1 & S2 heard, RRR. No JVD,  rubs, gallops or clicks. No murmurs. Gastrointestinal system: Abdomen is nondistended, soft and nontender. No organomegaly or  masses felt. Normal bowel sounds heard. Central nervous system: Alert and oriented. No focal neurological deficits. Extremities: No clubbing,  or cyanosis. No edema. Skin: No rashes, lesions or ulcers. Psychiatry: Judgement and insight appear normal. Mood & affect appropriate.    The results of significant diagnostics from this hospitalization (including imaging, microbiology, ancillary and laboratory) are listed below for reference.     Procedures and Diagnostic Studies:   CT Head Wo Contrast  Result Date: 10/30/2019 CLINICAL DATA:  Syncope, normal neuro exam, patient found unresponsive. EXAM: CT HEAD WITHOUT CONTRAST TECHNIQUE: Contiguous axial images were obtained from the base of the skull through the vertex without intravenous contrast. COMPARISON:  CT head 07/06/2009, maxillofacial CT 05/26/2011 FINDINGS: Brain: No evidence of acute infarction, hemorrhage, hydrocephalus, extra-axial collection or mass lesion/mass effect. Vascular: No hyperdense vessel or unexpected calcification. Skull: No calvarial fracture or suspicious osseous lesion. No scalp swelling or hematoma. Sinuses/Orbits: Paranasal sinuses and mastoid air cells are predominantly clear. Included orbital structures are unremarkable. Other: None IMPRESSION:  No acute intracranial findings. Electronically Signed   By: Lovena Le M.D.   On: 10/30/2019 02:04   DG Chest Portable 1 View  Result Date: 10/30/2019 CLINICAL DATA:  Syncope. EXAM: PORTABLE CHEST 1 VIEW COMPARISON:  08/02/2015 FINDINGS: There is no pneumothorax. No significant pleural effusion. There is some atelectasis at the lung bases. The lung volumes are are low. There is no displaced fracture. The heart size is normal. IMPRESSION: No acute cardiopulmonary process. Low lung volumes with bibasilar atelectasis. Electronically Signed   By: Constance Holster M.D.   On: 10/30/2019 01:18   ECHOCARDIOGRAM COMPLETE  Result Date: 10/30/2019    ECHOCARDIOGRAM REPORT   Patient  Name:   ELLEA BISEL Date of Exam: 10/30/2019 Medical Rec #:  [id number]             Height:       65.0 in Accession #:    [id number]            Weight:       140.0 lb Date of Birth:  1985-12-02             BSA:          1.700 m Patient Age:    79 years              BP:           117/81 mmHg Patient Gender: F                     HR:           76 bpm. Exam Location:  Inpatient Procedure: 2D Echo, Cardiac Doppler and Color Doppler Indications:    R55 Syncope  History:        Patient has no prior history of Echocardiogram examinations.                 Signs/Symptoms:Chest Pain. ETOH. Cocaine use. Overdose.  Sonographer:    Roseanna Rainbow RDCS Referring Phys: [id number] [facility] A SMITH  Sonographer Comments: Image acquisition challenging due to respiratory motion. IMPRESSIONS  1. Left ventricular ejection fraction, by estimation, is 60 to 65%. The left ventricle has normal function. The left ventricle has no regional wall motion abnormalities. There is mild left ventricular hypertrophy. Left ventricular diastolic parameters were normal.  2. Right ventricular systolic function is normal. The right ventricular size is normal.  3. The mitral valve is normal in structure. Trivial mitral valve regurgitation. No evidence of mitral stenosis.  4. The aortic valve is normal in structure. Aortic valve regurgitation is not visualized. No aortic stenosis is present. FINDINGS  Left Ventricle: Left ventricular ejection fraction, by estimation, is 60 to 65%. The left ventricle has normal function. The left ventricle has no regional wall motion abnormalities. The left ventricular internal cavity size was normal in size. There is  mild left ventricular hypertrophy. Left ventricular diastolic parameters were normal. Right Ventricle: The right ventricular size is normal. No increase in right ventricular wall thickness. Right ventricular systolic function is normal. Left Atrium: Left atrial size was normal in size. Right Atrium: Right  atrial size was normal in size. Pericardium: There is no evidence of pericardial effusion. Mitral Valve: The mitral valve is normal in structure. Trivial mitral valve regurgitation. No evidence of mitral valve stenosis. Tricuspid Valve: The tricuspid valve is normal in structure. Tricuspid valve regurgitation is mild . No evidence of tricuspid stenosis. Aortic Valve: The aortic valve is normal in structure. Aortic valve regurgitation  is not visualized. No aortic stenosis is present. Pulmonic Valve: The pulmonic valve was grossly normal. Pulmonic valve regurgitation is trivial. Aorta: The aortic root and ascending aorta are structurally normal, with no evidence of dilitation. IAS/Shunts: The atrial septum is grossly normal.  LEFT VENTRICLE PLAX 2D LVIDd:         3.65 cm     Diastology LVIDs:         2.50 cm     LV e' lateral:   8.33 cm/s LV PW:         1.55 cm     LV E/e' lateral: 9.0 LV IVS:        1.25 cm     LV e' medial:    7.29 cm/s LVOT diam:     1.80 cm     LV E/e' medial:  10.3 LV SV:         65 LV SV Index:   38 LVOT Area:     2.54 cm  LV Volumes (MOD) LV vol d, MOD A2C: 94.4 ml LV vol d, MOD A4C: 69.3 ml LV vol s, MOD A2C: 29.5 ml LV vol s, MOD A4C: 28.6 ml LV SV MOD A2C:     64.9 ml LV SV MOD A4C:     69.3 ml LV SV MOD BP:      48.5 ml RIGHT VENTRICLE             IVC RV S prime:     15.90 cm/s  IVC diam: 2.40 cm TAPSE (M-mode): 2.9 cm LEFT ATRIUM             Index       RIGHT ATRIUM          Index LA diam:        3.00 cm 1.76 cm/m  RA Area:     9.86 cm LA Vol (A2C):   24.5 ml 14.41 ml/m RA Volume:   23.00 ml 13.53 ml/m LA Vol (A4C):   23.8 ml 14.00 ml/m LA Biplane Vol: 26.0 ml 15.30 ml/m  AORTIC VALVE             PULMONIC VALVE LVOT Vmax:   130.00 cm/s PR End Diast Vel: 1.50 msec LVOT Vmean:  95.500 cm/s LVOT VTI:    0.254 m  AORTA Ao Root diam: 2.90 cm MITRAL VALVE MV Area (PHT): 3.65 cm    SHUNTS MV Decel Time: 208 msec    Systemic VTI:  0.25 m MV E velocity: 74.80 cm/s  Systemic Diam: 1.80 cm MV  A velocity: 48.00 cm/s MV E/A ratio:  1.56 Mertie Moores MD Electronically signed by Mertie Moores MD Signature Date/Time: 10/30/2019/11:26:46 AM    Final      Labs:   Basic Metabolic Panel: Recent Labs  Lab 10/30/19 0056 10/30/19 0056 10/30/19 0748 10/31/19 0533  NA 139  --  138 138  K 5.3*   < > 4.9 4.1  CL 104  --  108 107  CO2 16*  --  22 23  GLUCOSE 208*  --  84 103*  BUN 15  --  13 8  CREATININE 1.40*  --  0.88 0.81  CALCIUM 9.2  --  7.7* 8.4*  MG  --   --  1.8  --    < > = values in this interval not displayed.   GFR Estimated Creatinine Clearance: 89.7 mL/min (by C-G formula based on SCr of 0.81 mg/dL). Liver Function Tests: Recent Labs  Lab  10/30/19 0056 10/31/19 0533  AST 61* 31  ALT 32 27  ALKPHOS 78 44  BILITOT 0.5 0.4  PROT 7.7 5.8*  ALBUMIN 3.4* 2.6*   No results for input(s): LIPASE, AMYLASE in the last 168 hours. No results for input(s): AMMONIA in the last 168 hours. Coagulation profile No results for input(s): INR, PROTIME in the last 168 hours.  CBC: Recent Labs  Lab 10/30/19 0056 10/31/19 0533  WBC 18.0* 11.2*  NEUTROABS 12.0*  --   HGB 10.9* 8.7*  HCT 37.6 28.8*  MCV 74.9* 71.5*  PLT 408* 275   Cardiac Enzymes: Recent Labs  Lab 10/30/19 0056  CKTOTAL 174   BNP: Invalid input(s): POCBNP CBG: No results for input(s): GLUCAP in the last 168 hours. D-Dimer No results for input(s): DDIMER in the last 72 hours. Hgb A1c No results for input(s): HGBA1C in the last 72 hours. Lipid Profile No results for input(s): CHOL, HDL, LDLCALC, TRIG, CHOLHDL, LDLDIRECT in the last 72 hours. Thyroid function studies No results for input(s): TSH, T4TOTAL, T3FREE, THYROIDAB in the last 72 hours.  Invalid input(s): FREET3 Anemia work up [employer]    10/30/19 0917  FERRITIN 8*  TIBC 497*  IRON 16*   Microbiology Recent Results (from the past 240 hour(s))  SARS Coronavirus 2 by RT PCR (hospital order, performed in [facility] hospital  lab) Nasopharyngeal Nasopharyngeal Swab     Status: None   Collection Time: 10/30/19  4:43 AM   Specimen: Nasopharyngeal Swab  Result Value Ref Range Status   SARS Coronavirus 2 NEGATIVE NEGATIVE Final    Comment: (NOTE) SARS-CoV-2 target nucleic acids are NOT DETECTED. The SARS-CoV-2 RNA is generally detectable in upper and lower respiratory specimens during the acute phase of infection. The lowest concentration of SARS-CoV-2 viral copies this assay can detect is 250 copies / mL. A negative result does not preclude SARS-CoV-2 infection and should not be used as the sole basis for treatment or other patient management decisions.  A negative result may occur with improper specimen collection / handling, submission of specimen other than nasopharyngeal swab, presence of viral mutation(s) within the areas targeted by this assay, and inadequate number of viral copies (<250 copies / mL). A negative result must be combined with clinical observations, patient history, and epidemiological information. Fact Sheet for Patients:   StrictlyIdeas.no Fact Sheet for Healthcare Providers: BankingDealers.co.za This test is not yet approved or cleared  by the [place] FDA and has been authorized for detection and/or diagnosis of SARS-CoV-2 by FDA under an Emergency Use Authorization (EUA).  This EUA will remain in effect (meaning this test can be used) for the duration of the COVID-19 declaration under Section 564(b)(1) of the Act, 21 U.S.C. section 360bbb-3(b)(1), unless the authorization is terminated or revoked sooner. Performed at [facility], [place] [address], [place], [place] [address]      Discharge Instructions:    Allergies as of 10/31/2019   No Known Allergies     Medication List    STOP taking these medications   cyclobenzaprine 10 MG tablet Commonly known as: FLEXERIL   ibuprofen 800 MG tablet Commonly known as: ADVIL    metroNIDAZOLE 500 MG tablet Commonly known as: FLAGYL   oxyCODONE 5 MG immediate release tablet Commonly known as: Oxy IR/ROXICODONE     TAKE these medications   diphenhydrAMINE 25 MG tablet Commonly known as: BENADRYL Take 50 mg by mouth daily as needed for itching, allergies or sleep.   ferrous  sulfate 325 (65 FE) MG tablet Take 1 tablet (325 mg total) by mouth 2 (two) times daily with a meal.         Time coordinating discharge: 40 minutes  Signed:  Radene Gunning NP  [facility] 10/31/2019, 10:45 AM

## 2021-10-23 ENCOUNTER — Inpatient Hospital Stay (HOSPITAL_COMMUNITY)
Admission: AD | Admit: 2021-10-23 | Discharge: 2021-10-23 | Disposition: A | Discharge status: Home / Self Care | Payer: Medicaid Other | Attending: Obstetrics and Gynecology | Admitting: Obstetrics and Gynecology

## 2022-02-19 ENCOUNTER — Emergency Department (HOSPITAL_COMMUNITY)
Admission: EM | Admit: 2022-02-19 | Discharge: 2022-02-19 | Disposition: A | Discharge status: Home / Self Care | Payer: Medicaid Other | Attending: Emergency Medicine | Admitting: Emergency Medicine

## 2022-02-19 ENCOUNTER — Encounter (HOSPITAL_COMMUNITY): Payer: Self-pay | Admitting: Emergency Medicine

## 2022-02-19 ENCOUNTER — Emergency Department (HOSPITAL_COMMUNITY): Payer: Medicaid Other

## 2022-02-19 DIAGNOSIS — R519 Headache, unspecified: Secondary | ICD-10-CM | POA: Insufficient documentation

## 2022-02-19 DIAGNOSIS — R202 Paresthesia of skin: Secondary | ICD-10-CM | POA: Insufficient documentation

## 2022-02-19 LAB — CBC WITH DIFFERENTIAL/PLATELET
Abs Immature Granulocytes: 0.02 10*3/uL (ref 0.00–0.07)
Basophils Absolute: 0 10*3/uL (ref 0.0–0.1)
Basophils Relative: 0 %
Eosinophils Absolute: 0.6 10*3/uL — ABNORMAL HIGH (ref 0.0–0.5)
Eosinophils Relative: 6 %
HCT: 36.2 % (ref 36.0–46.0)
Hemoglobin: 11.7 g/dL — ABNORMAL LOW (ref 12.0–15.0)
Immature Granulocytes: 0 %
Lymphocytes Relative: 15 %
Lymphs Abs: 1.4 10*3/uL (ref 0.7–4.0)
MCH: 24.6 pg — ABNORMAL LOW (ref 26.0–34.0)
MCHC: 32.3 g/dL (ref 30.0–36.0)
MCV: 76.1 fL — ABNORMAL LOW (ref 80.0–100.0)
Monocytes Absolute: 0.7 10*3/uL (ref 0.1–1.0)
Monocytes Relative: 8 %
Neutro Abs: 6.9 10*3/uL (ref 1.7–7.7)
Neutrophils Relative %: 71 %
Platelets: 283 10*3/uL (ref 150–400)
RBC: 4.76 MIL/uL (ref 3.87–5.11)
RDW: 16.6 % — ABNORMAL HIGH (ref 11.5–15.5)
WBC: 9.7 10*3/uL (ref 4.0–10.5)
nRBC: 0 % (ref 0.0–0.2)

## 2022-02-19 LAB — MAGNESIUM: Magnesium: 2.3 mg/dL (ref 1.7–2.4)

## 2022-02-19 LAB — I-STAT BETA HCG BLOOD, ED (MC, WL, AP ONLY): I-stat hCG, quantitative: <5 m[IU]/mL (ref <5)

## 2022-02-19 LAB — COMPREHENSIVE METABOLIC PANEL
ALT: 18 U/L (ref 0–44)
AST: 21 U/L (ref 15–41)
Albumin: 4 g/dL (ref 3.5–5.0)
Alkaline Phosphatase: 56 U/L (ref 38–126)
Anion gap: 9 (ref 5–15)
BUN: 6 mg/dL (ref 6–20)
CO2: 28 mmol/L (ref 22–32)
Calcium: 9.3 mg/dL (ref 8.9–10.3)
Chloride: 100 mmol/L (ref 98–111)
Creatinine, Ser: 0.64 mg/dL (ref 0.44–1.00)
GFR, Estimated: >60 mL/min (ref >60)
Glucose, Bld: 98 mg/dL (ref 70–99)
Potassium: 3.5 mmol/L (ref 3.5–5.1)
Sodium: 137 mmol/L (ref 135–145)
Total Bilirubin: 0.6 mg/dL (ref 0.3–1.2)
Total Protein: 8.1 g/dL (ref 6.5–8.1)

## 2022-02-19 LAB — CBG MONITORING, ED: Glucose-Capillary: 101 mg/dL — ABNORMAL HIGH (ref 70–99)

## 2022-02-19 MED ORDER — METOCLOPRAMIDE HCL 5 MG/ML IJ SOLN
10.0000 mg | Freq: Once | INTRAMUSCULAR | Status: AC
  Administered 2022-02-19: 10 mg via INTRAVENOUS
  Filled 2022-02-19: qty 2

## 2022-02-19 MED ORDER — DIPHENHYDRAMINE HCL 50 MG/ML IJ SOLN
25.0000 mg | Freq: Once | INTRAMUSCULAR | Status: AC
  Administered 2022-02-19: 25 mg via INTRAVENOUS
  Filled 2022-02-19: qty 1

## 2022-02-19 MED ORDER — SODIUM CHLORIDE 0.9 % IV BOLUS
1000.0000 mL | Freq: Once | INTRAVENOUS | Status: AC
  Administered 2022-02-19: 1000 mL via INTRAVENOUS

## 2022-02-19 NOTE — ED Provider Notes (Signed)
[facility] DEPT Provider Note   CSN: [id number] Arrival date & time: 02/19/22  0808     History  Chief Complaint  Patient presents with   Headache   Emesis    Audrey Dougherty is a 36 y.o. female.  HPI 36 year old female presents with left-sided headache and left arm tingling.  She states the headache and left arm tingling started nearly the same time at around 7 am.  She states she was partying with friends last night which includes alcohol, Percocet, and cocaine.  She never went to sleep.  Developed this sudden and severe headache at the same time of having diffuse left arm tingling.  Feels like there are extra vibrations in her arm.  However she denies any sensory changes otherwise such as loss of sensation and there is no weakness.  It is only involved in her left arm.  No falls or passing out.  No neck pain or facial/leg symptoms. She did vomit prior to these symptoms.  Home Medications Prior to Admission medications   Medication Sig Start Date End Date Taking? Authorizing Provider  diphenhydrAMINE (BENADRYL) 25 MG tablet Take 50 mg by mouth daily as needed for itching, allergies or sleep.    [provider]  ferrous sulfate 325 (65 FE) MG tablet Take 1 tablet (325 mg total) by mouth 2 (two) times daily with a meal. Patient not taking: Reported on 10/30/2019 05/07/17   Marjie Skiff, MD      Allergies    Patient has no known allergies.    Review of Systems   Review of Systems  Constitutional:  Negative for fever.  Eyes:  Positive for photophobia. Negative for visual disturbance.  Gastrointestinal:  Positive for vomiting. Negative for abdominal pain.  Musculoskeletal:  Negative for neck pain.  Neurological:  Positive for numbness and headaches. Negative for weakness.    Physical Exam Updated Vital Signs BP (!) 152/106   Pulse 84   Temp 98.3 F (36.8 C) (Oral)   Resp 18   SpO2 100%  Physical Exam Vitals and nursing  note reviewed.  Constitutional:      General: She is not in acute distress.    Appearance: She is well-developed. She is not ill-appearing or diaphoretic.  HENT:     Head: Normocephalic and atraumatic.  Eyes:     Extraocular Movements: Extraocular movements intact.     Pupils: Pupils are equal, round, and reactive to light.     Comments: +photophobia Normal visual fields  Cardiovascular:     Rate and Rhythm: Normal rate and regular rhythm.     Pulses:          Radial pulses are 2+ on the left side.     Heart sounds: Normal heart sounds.  Pulmonary:     Effort: Pulmonary effort is normal.     Breath sounds: Normal breath sounds.  Abdominal:     Palpations: Abdomen is soft.     Tenderness: There is no abdominal tenderness.  Musculoskeletal:     Cervical back: Normal range of motion. No rigidity.  Skin:    General: Skin is warm and dry.  Neurological:     Mental Status: She is alert.     Comments: CN 3-12 grossly intact. 5/5 strength in all 4 extremities. Grossly normal sensation. Normal finger to nose.      ED Results / Procedures / Treatments   Labs (all labs ordered are listed, but only abnormal results are displayed) Labs Reviewed  CBC WITH DIFFERENTIAL/PLATELET - Abnormal; Notable for the following components:      Result Value   Hemoglobin 11.7 (*)    MCV 76.1 (*)    MCH 24.6 (*)    RDW 16.6 (*)    Eosinophils Absolute 0.6 (*)    All other components within normal limits  CBG MONITORING, ED - Abnormal; Notable for the following components:   Glucose-Capillary 101 (*)    All other components within normal limits  COMPREHENSIVE METABOLIC PANEL  MAGNESIUM  I-STAT BETA HCG BLOOD, ED ([facility], [facility], [facility] ONLY)    EKG EKG Interpretation  Date/Time:  Thursday February 19 2022 09:24:11 EDT Ventricular Rate:  77 PR Interval:  153 QRS Duration: 80 QT Interval:  368 QTC Calculation: 417 R Axis:   94 Text Interpretation: Sinus rhythm RAE, consider biatrial enlargement  Borderline right axis deviation Consider left ventricular hypertrophy Nonspecific T abnrm, anterolateral leads Confirmed by Sherwood Gambler [phone]) on 02/19/2022 9:36:27 AM  Radiology CT Head Wo Contrast  Result Date: 02/19/2022 CLINICAL DATA:  Headaches EXAM: CT HEAD WITHOUT CONTRAST TECHNIQUE: Contiguous axial images were obtained from the base of the skull through the vertex without intravenous contrast. RADIATION DOSE REDUCTION: This exam was performed according to the departmental dose-optimization program which includes automated exposure control, adjustment of the mA and/or kV according to patient size and/or use of iterative reconstruction technique. COMPARISON:  10/30/2019 FINDINGS: Brain: No acute intracranial findings are seen. There are no signs of bleeding within the cranium. Ventricles are not dilated. There is no focal edema or mass effect. Vascular: Unremarkable. Skull: Unremarkable. Sinuses/Orbits: There is mild mucosal thickening in ethmoid and sphenoid sinuses. Other: None. IMPRESSION: No acute intracranial findings are seen in noncontrast CT brain. Chronic sinusitis. Electronically Signed   By: Elmer Picker M.D.   On: 02/19/2022 10:30    Procedures Procedures    Medications Ordered in ED Medications  metoCLOPramide (REGLAN) injection 10 mg (10 mg Intravenous Given 02/19/22 0940)  diphenhydrAMINE (BENADRYL) injection 25 mg (25 mg Intravenous Given 02/19/22 0941)  sodium chloride 0.9 % bolus 1,000 mL (1,000 mLs Intravenous New Bag/Given 02/19/22 2536)    ED Course/ Medical Decision Making/ A&P Clinical Course as of 02/19/22 1109  Thu Feb 19, 2022  0914 I briefly discussed with Dr. Leonel Ramsay. Given NIH 0, positive symptoms (tingling) and headache, seems much more likely to be complicated migraine than stroke. Will not call code stroke and treat headache with meds and get head CT and labs. If not better may need MRI.  [SG]    Clinical Course User Index [SG] Sherwood Gambler, MD                           Medical Decision Making Amount and/or Complexity of Data Reviewed Labs: ordered.    Details: Electrolytes normal.  Chronic anemia and normal WBC today.  She is not pregnant. Radiology: ordered and independent interpretation performed.    Details: No head bleed/subarachnoid hemorrhage ECG/medicine tests: independent interpretation performed.    Details: Nonspecific T waves but no ischemic symptoms  Risk Prescription drug management.   Patient presents with headache after drugs and alcohol abuse last night.  While the headache was sudden onset, head CT fortunately does not show any obvious subarachnoid hemorrhage.  It was done well within 6 hours and she otherwise appears well with no neck pain or stiffness, to make me think that subarachnoid hemorrhage is less likely and I do not  think further work-up such as CTA or LP is needed.  Doubt infection.  She has had the headache resolved after IV treatment here.  The tingling has also improved.  Discussed that we could pursue MRI though she declines at this point.  My suspicion for stroke is low as above.  At this point I have stressed the importance of stopping drugs such as cocaine and have recommended she follow-up with neurology as an outpatient.  Given return precautions.        Final Clinical Impression(s) / ED Diagnoses Final diagnoses:  Paresthesia  Left-sided headache    Rx / DC Orders ED Discharge Orders          Ordered    Ambulatory referral to Neurology       Comments: An appointment is requested in approximately: 2 weeks   02/19/22 1100              Sherwood Gambler, MD 02/19/22 1110

## 2022-02-19 NOTE — ED Triage Notes (Signed)
Pt here from home with c/o h/a and arm pain along with some n/v after partying last night , states that she did take some percocet and did some cocaine along with drinking etoh

## 2022-02-19 NOTE — Discharge Instructions (Signed)
I'm glad you're feeling better. While your workup today and improvement in symptoms is reassuring, you should still follow up with a primary care doctor and a neurologist.   It is very important to stop drugs such as cocaine as these can cause acute or chronic issues, even stroke.  If you develop continued, recurrent, or worsening headache, fever, neck stiffness, vomiting, blurry or double vision, weakness or numbness in your arms or legs, trouble speaking, or any other new/concerning symptoms then return to the ER for evaluation.

## 2022-03-05 ENCOUNTER — Ambulatory Visit: Payer: Medicaid Other | Admitting: Diagnostic Neuroimaging

## 2022-03-05 ENCOUNTER — Encounter: Payer: Self-pay | Admitting: Diagnostic Neuroimaging

## 2022-12-15 NOTE — Congregational Nurse Program (Signed)
Nurse met with client and her 36 year old daughter  Audrey Dougherty [phone]  Client reports that she and her daughter are healthy, and that daughter is up to date with immunizations. Client reports that her daughter will be going to kindergarten in the Fall. Client wishes to find a PCP here for her and her daughter.  Client reports that she is looking for employment.  Plan: Nurse discussed routine screenings that client should be having (Pap and mammogram) Also that daughter would be needing a Kindergarten Assessment done prior to enrollment.  Nurse e-mailed client a calendar with schedule for [facility].  Nurse will follow up with client.   Audrey Dougherty D. [facility] Congregational Nurse [employer] [phone] Congregational

## 2024-03-03 NOTE — Discharge Summary (Signed)
 [facility]   Obstetrics Discharge Note  Discharge Details   Admit date:         02/26/2024 Discharge date:         03/03/2024  Hospital Days:    5 days  Active Hospital Problems   Diagnosis Date Noted POA  . *C-Hyst 02/28/24, female 02/28/2024 Unknown  . Placenta previa with hemorrhage, third trimester (*) 02/26/2024 Yes  . [redacted] weeks gestation of pregnancy (*) 02/26/2024 Not Applicable  . Antepartum hemorrhage from placenta previa (*) 02/26/2024 Yes    Resolved Hospital Problems  No resolved problems to display.      Current Discharge Medication List     START taking these medications      Details  docusate sodium  capsule Commonly known as: COLACE,DOK,DOCQLACE  Take one capsule (100 mg dose) by mouth 2 (two) times a day as needed for Constipation. Quantity: 60 capsule   ibuprofen  800 mg tablet Commonly known as: ADVIL ,MOTRIN   Take one tablet (800 mg dose) by mouth every 8 (eight) hours as needed for Pain. Quantity: 60 tablet   oxyCODONE  HCl 5 mg immediate release tablet Commonly known as: ROXICODONE   Take one tablet (5 mg dose) by mouth every 4 (four) hours as needed (Pain Level 3-6) for up to 7 days. Max Daily Amount: 30 mg Quantity: 20 tablet       CONTINUE these medications which have NOT CHANGED      Details  buPROPion 150 mg 12 hr tablet Commonly known as: WELLBUTRIN SR  Take one tablet (150 mg dose) by mouth daily.   diphenhydrAMINE  25 mg tablet Commonly known as: BANOPHEN   Take two tablets (50 mg dose) by mouth every 8 (eight) hours as needed.   hydrOXYzine HCl 50 mg tablet Commonly known as: ATARAX  Take one tablet (50 mg dose) by mouth 3 (three) times a day as needed.   Iron  325 (65 Fe) MG Tabs  Take one tablet (325 mg dose) by mouth daily. Quantity: 30 tablet   PRENATAL VITAMIN PO  Take by mouth.   QUEtiapine fumarate 200 mg tablet Commonly known as: SEROQUEL  Take one tablet (200 mg dose) by mouth at bedtime.       * You might also be taking other medications not listed above. If you have questions about any of your other medications, talk to the person who prescribed them or your Primary Care Provider.          STOP taking these medications    ACCU-CHEK GUIDE test strip Generic drug: glucose blood   ACCU-CHEK GUIDE w/Device Kit   ACCU-CHEK SOFTCLIX LANCETS lancets   Prenatal Adult Gummy/DHA/FA 0.4-25 MG Chew   valACYclovir 500 mg tablet Commonly known as: VALTREX        Assessment/Plan   Active Hospital Problems   Diagnosis Date Noted POA  . *C-Hyst 02/28/24, female 02/28/2024 Unknown  . Placenta previa with hemorrhage, third trimester (*) 02/26/2024 Yes  . [redacted] weeks gestation of pregnancy (*) 02/26/2024 Not Applicable  . Antepartum hemorrhage from placenta previa (*) 02/26/2024 Yes    Resolved Hospital Problems  No resolved problems to display.    Physicians involved in care during this hospitalization Attending Provider: Prentice Kerns, MD Attending Provider: Thersia LOISE Naegeli, MD Admitting Provider: Prentice Kerns, MD Consulting Physician: Norleen JONELLE Allen, MD Consulting Physician: Fonda GORMAN Rear, DO Anesthesiologist: Kieth Norleen Shorter III, MD Bedside Procedures   No orders found     Procedure(s) (LRB): CESAREAN SECTION (N/A)  HYSTERECTOMY TOTAL ABDOMINAL WITH BILATERAL SALPINGECTOMY (N/A) URETEROLYSIS (Left)  02/28/2024  Surgeon(s): Fonda GORMAN Rear, DO Thersia LOISE Naegeli, MD -------------------   Condition at Discharge: stable      Appointments which have been scheduled    Apr 25, 2024 1:00 PM Postpartum with Rogers Gall, NP [facility] (--) [address] [place] [address] [phone]      Interval History  History of Present Illness Patient presented at 106w3d for vaginal bleeding in setting of known placenta previa, suspected accreta/percreta.  She had already received BMZ at a prior admission.  She  initially was stable, then had recurrence of bleeding at [redacted]w[redacted]d (02/28/24) and decision was made to proceed with delivery.  She underwent a classical cesarean with hysterectomy and bilateral salpingectomy with gyn-oncology [place]) and urology Garald).  She received intra-operative transfusion of 3 units PRBC, 2 FFP, 1 platelet.  On POD#1, she received IV Iron  and an additional unit of PRBC.  Her hemoglobin stabilized.  She was tolerating po pain meds, ambulating, and afebrile prior to admission on POD#4.     Medications:  . buPROPion  150 mg Oral Daily  . docusate sodium   100 mg Oral BID  . ferrous sulfate   325 mg Oral Daily  . ibuprofen   800 mg Oral Q8H  . LR  1,000 mL IntraVENous ONCE  . prenatal multivitamin  1 tablet Oral Daily  . QUEtiapine fumarate  200 mg Oral HS    Physical Examination  Temp:  [98.4 F (36.9 C)-98.8 F (37.1 C)] 98.8 F (37.1 C) Heart Rate:  [92-97] 92 Resp:  [17-18] 17 BP: (104-125)/(56-77) 107/56 SpO2:  [99 %] 99 % O2 Device: None (Room air)    No intake or output data in the 24 hours ending 03/03/24 0700  Physical Exam Gen: alert, no distress Abd: midline vertical incision is C/D/I, steris on lower 1/3, appropriately tender Ext: no calf tenderness   Delivery: Date of Birth: 02/28/2023  Time of Birth: 7:58 AM   Information for the patient's newborn:  Copelin, NBF Zakara Aulora [[id number]]  female Birth History  . Birth    Length: 15.95 (40.5 cm)    Weight: 2.11 kg (4 lb 10.4 oz)    HC 30 cm (11.81)  . Apgar    One: 7    Five: 7  . Delivery Method: C-Section, Classical  . Gestation Age: 52 5/7 wks    Results  Labs:  Recent Results (from the past 24 hours)  CBC   Collection Time: 03/02/24  7:12 AM  Result Value Ref Range   WBC 9.6 4.0 - 10.5 thou/mcL   RBC 2.77 (L) 3.93 - 5.22 million/mcL   HGB 7.3 (L) 11.2 - 15.7 gm/dL   HCT 77.0 (L) 65.8 - 55.0 %   MCV 82.7 79.4 - 94.8 fL   MCH 26.4 25.6 - 32.2 pg   MCHC 31.9 (L) 32.2 - 35.5  gm/dL   Plt Ct 817 849 - 599 thou/mcL   RDW SD 47.2 (H) 35.1 - 46.3 fL   MPV 10.9 9.4 - 12.4 fL   NRBC% 0.2 0.0 - 0.2 /100WBC   Absolute NRBC Count 0.02 (H) 0.00 - 0.01 thou/mcL   Unresulted Labs     Order Current Status   CBC Collected (03/03/24 0704)   Prepare Plasma, 2 Units Preliminary result   Prepare Platelet Pheresis, 1 Units Preliminary result       Electronically signed: Thersia LOISE Naegeli, MD 03/03/2024 / 7:09  AM   *Some images could not be shown.

## 2024-04-23 ENCOUNTER — Encounter (HOSPITAL_BASED_OUTPATIENT_CLINIC_OR_DEPARTMENT_OTHER): Payer: Self-pay

## 2024-04-23 ENCOUNTER — Emergency Department (HOSPITAL_BASED_OUTPATIENT_CLINIC_OR_DEPARTMENT_OTHER): Payer: MEDICAID

## 2024-04-23 ENCOUNTER — Emergency Department (HOSPITAL_BASED_OUTPATIENT_CLINIC_OR_DEPARTMENT_OTHER)
Admission: EM | Admit: 2024-04-23 | Discharge: 2024-04-24 | Disposition: A | Discharge status: Other Acute Inpatient Hospital | Payer: MEDICAID | Attending: Emergency Medicine | Admitting: Emergency Medicine

## 2024-04-23 DIAGNOSIS — Z79899 Other long term (current) drug therapy: Secondary | ICD-10-CM | POA: Insufficient documentation

## 2024-04-23 DIAGNOSIS — R319 Hematuria, unspecified: Secondary | ICD-10-CM | POA: Diagnosis not present

## 2024-04-23 DIAGNOSIS — T81328A Disruption or dehiscence of closure of other specified internal operation (surgical) wound, initial encounter: Secondary | ICD-10-CM

## 2024-04-23 DIAGNOSIS — R1030 Lower abdominal pain, unspecified: Secondary | ICD-10-CM | POA: Diagnosis present

## 2024-04-23 DIAGNOSIS — T81321A Disruption or dehiscence of closure of internal operation (surgical) wound of abdominal wall muscle or fascia, initial encounter: Secondary | ICD-10-CM | POA: Diagnosis not present

## 2024-04-23 DIAGNOSIS — N939 Abnormal uterine and vaginal bleeding, unspecified: Secondary | ICD-10-CM | POA: Diagnosis not present

## 2024-04-23 LAB — URINALYSIS, ROUTINE W REFLEX MICROSCOPIC
Bacteria, UA: NONE SEEN
Bilirubin Urine: NEGATIVE
Glucose, UA: NEGATIVE mg/dL
Hgb urine dipstick: NEGATIVE
Ketones, ur: NEGATIVE mg/dL
Nitrite: NEGATIVE
Specific Gravity, Urine: 1.032 — ABNORMAL HIGH (ref 1.005–1.030)
pH: 6 (ref 5.0–8.0)

## 2024-04-23 LAB — BASIC METABOLIC PANEL WITH GFR
Anion gap: 11 (ref 5–15)
BUN: 11 mg/dL (ref 6–20)
CO2: 25 mmol/L (ref 22–32)
Calcium: 10.1 mg/dL (ref 8.9–10.3)
Chloride: 101 mmol/L (ref 98–111)
Creatinine, Ser: 0.88 mg/dL (ref 0.44–1.00)
GFR, Estimated: >60 mL/min (ref >60)
Glucose, Bld: 103 mg/dL — ABNORMAL HIGH (ref 70–99)
Potassium: 4.1 mmol/L (ref 3.5–5.1)
Sodium: 137 mmol/L (ref 135–145)

## 2024-04-23 LAB — CBC
HCT: 36.5 % (ref 36.0–46.0)
Hemoglobin: 12.2 g/dL (ref 12.0–15.0)
MCH: 26.9 pg (ref 26.0–34.0)
MCHC: 33.4 g/dL (ref 30.0–36.0)
MCV: 80.4 fL (ref 80.0–100.0)
Platelets: 327 K/uL (ref 150–400)
RBC: 4.54 MIL/uL (ref 3.87–5.11)
RDW: 14.6 % (ref 11.5–15.5)
WBC: 11.8 K/uL — ABNORMAL HIGH (ref 4.0–10.5)
nRBC: 0 % (ref 0.0–0.2)

## 2024-04-23 MED ORDER — MORPHINE SULFATE (PF) 2 MG/ML IV SOLN
2.0000 mg | Freq: Once | INTRAVENOUS | Status: AC
  Administered 2024-04-23: 2 mg via INTRAVENOUS
  Filled 2024-04-23: qty 1

## 2024-04-23 MED ORDER — IOHEXOL 300 MG/ML  SOLN
100.0000 mL | Freq: Once | INTRAMUSCULAR | Status: AC | PRN
  Administered 2024-04-23: 100 mL via INTRAVENOUS

## 2024-04-23 MED ORDER — PIPERACILLIN-TAZOBACTAM 3.375 G IVPB 30 MIN
3.3750 g | Freq: Once | INTRAVENOUS | Status: AC
  Administered 2024-04-23: 3.375 g via INTRAVENOUS
  Filled 2024-04-23: qty 50

## 2024-04-23 NOTE — ED Notes (Signed)
Pt given breast pump per request

## 2024-04-23 NOTE — ED Notes (Signed)
Pelvic cart set up at bedside  

## 2024-04-23 NOTE — ED Triage Notes (Signed)
 Pt to triage c/o hematuria with abd pain 9/10 sharp in nature x 24 hours. Pt denies fever chills. Pt state she had Hysterectomy 9/22 and is concerned bleeding good be assoc. VSS NAD PT on room air.

## 2024-04-23 NOTE — ED Provider Notes (Signed)
 [place] EMERGENCY DEPARTMENT AT [facility] Provider Note   CSN: [id number] Arrival date & time: 04/23/24  1911     History Chief Complaint  Patient presents with   Hematuria    HPI: [name] is a 38 y.o. female with history pertinent for hysterectomy due to placenta previa who presents complaining of vaginal bleeding, abdominal pain. Patient arrived via POV.  History provided by patient.  No interpreter required during this encounter.  Patient reports that on 9/22 she had a cesarean delivery and a hysterectomy due to placenta previa and suspected suspected accreta/percreta, patient had complicated hysterectomy with intraoperative transfusion of 3 units PRBC, 2 FFP, 1 platelet.  Patient reports that she was recently moving, and therefore was lifting heavy objects, and going up and down the stairs multiple times.  Reports that she felt a gush of blood from her vagina, and since then has had pain in her lower abdomen, as well as hematuria and ongoing mild vaginal bleeding.  Given that she was concerned that she has a postoperative complication wanted to come to the emergency department for further evaluation.  Patient's recorded medical, surgical, social, medication list and allergies were reviewed in the Snapshot window as part of the initial history.   Prior to Admission medications   Medication Sig Start Date End Date Taking? Authorizing Provider  diphenhydrAMINE  (BENADRYL ) 25 MG tablet Take 50 mg by mouth daily as needed for itching, allergies or sleep.    [provider]  ferrous sulfate  325 (65 FE) MG tablet Take 1 tablet (325 mg total) by mouth 2 (two) times daily with a meal. Patient not taking: Reported on 10/30/2019 05/07/17   Diallo, Abdoulaye, MD     Allergies: Patient has no known allergies.   Review of Systems   ROS as per HPI  Physical Exam Updated Vital Signs BP 123/81   Pulse 90   Temp 98.4 F (36.9 C)   Resp 16   Ht 5' 5  (1.651 m)   Wt 80.7 kg   LMP 07/23/2019   SpO2 100%   Breastfeeding No   BMI 29.62 kg/m  Physical Exam Vitals and nursing note reviewed.  Constitutional:      General: She is not in acute distress.    Appearance: She is well-developed.  HENT:     Head: Normocephalic and atraumatic.  Eyes:     Conjunctiva/sclera: Conjunctivae normal.  Cardiovascular:     Rate and Rhythm: Normal rate and regular rhythm.     Heart sounds: No murmur heard. Pulmonary:     Effort: Pulmonary effort is normal. No respiratory distress.     Breath sounds: Normal breath sounds.  Abdominal:     Palpations: Abdomen is soft.     Tenderness: There is abdominal tenderness (Lower abdominal tenderness to palpation).     Comments: Well-healed lower abdominal surgical scar  Musculoskeletal:        General: No swelling.     Cervical back: Neck supple.  Skin:    General: Skin is warm and dry.     Capillary Refill: Capillary refill takes less than 2 seconds.  Neurological:     Mental Status: She is alert.  Psychiatric:        Mood and Affect: Mood normal.     ED Course/ Medical Decision Making/ A&P    Procedures Procedures   Medications Ordered in ED Medications  morphine  (PF) 2 MG/ML injection 2 mg (2 mg Intravenous Given 04/23/24 2158)  iohexol  (OMNIPAQUE )  300 MG/ML solution 100 mL (100 mLs Intravenous Contrast Given 04/23/24 2216)  piperacillin-tazobactam (ZOSYN) IVPB 3.375 g (0 g Intravenous Stopped 04/23/24 2339)  morphine  (PF) 4 MG/ML injection 4 mg (4 mg Intravenous Given 04/24/24 0023)    Medical Decision Making:   Audrey Dougherty is a 38 y.o. female who presents for abdominal pain and vaginal bleeding as per above.  Physical exam is pertinent for moderate tenderness in the lower abdomen.  The differential includes but is not limited to postoperative complication, vaginal cuff dehiscence, abscess, ectopic pregnancy, appendicitis, nephrolithiasis, hematuria.  Independent historian:  None  External data reviewed: Notes: Reviewed discharge summary from 9/26 from [facility]  Initial Plan:  Screening labs including CBC and Metabolic panel to evaluate for infectious or metabolic etiology of disease.  Urinalysis with reflex culture ordered to evaluate for UTI or relevant urologic/nephrologic pathology.  CT abdomen pelvis to evaluate for structural/infectious intra-abdominal pathology.  Objective evaluation as below reviewed   Labs: Ordered, Independent interpretation, and Details: CBC with mild nonspecific leukocytosis to 11.8.  No anemia or thrombocytopenia.  BMP without AKI, emergent electrolyte derangement, emergent LFT abnormality.  UA without UTI, mild hematuria  Radiology: Ordered, Independent interpretation, Details: Personally reviewed patient CT of the abdomen and pelvis, I do appreciate hyperenhancement and fat stranding in the pelvis and the region where uterus would be, presumably at the edge of the vaginal cuff, and All images reviewed independently.  Agree with radiology report at this time.   CT ABDOMEN PELVIS W CONTRAST Result Date: 04/23/2024 EXAM: CT ABDOMEN AND PELVIS WITH CONTRAST 04/23/2024 10:16:05 PM TECHNIQUE: CT of the abdomen and pelvis was performed with the administration of 100 mL of iohexol  (OMNIPAQUE ) 300 MG/ML solution. Multiplanar reformatted images are provided for review. Automated exposure control, iterative reconstruction, and/or weight-based adjustment of the mA/kV was utilized to reduce the radiation dose to as low as reasonably achievable. COMPARISON: None available. CLINICAL HISTORY: Evaluate for postoperative complication, patient with vaginal bleeding after lifting heavy objects and lower abdominal pain, status post hysterectomy on 9/22. FINDINGS: LOWER CHEST: No acute abnormality. LIVER: The liver is unremarkable. GALLBLADDER AND BILE DUCTS: Gallbladder is unremarkable. No biliary ductal dilatation. SPLEEN: No acute abnormality. PANCREAS:  No acute abnormality. ADRENAL GLANDS: No acute abnormality. KIDNEYS, URETERS AND BLADDER: No stones in the kidneys or ureters. No hydronephrosis. No perinephric or periureteral stranding. Urinary bladder is unremarkable. GI AND BOWEL: Moderate colonic stool burden. No obstruction. Appendix normal. The stomach, small bowel, and large bowel are otherwise unremarkable. PERITONEUM AND RETROPERITONEUM: There is diffuse infiltration of the mesenteric fat, a nonspecific finding that may reflect mesenteric edema or inflammatory changes can be seen with peritonitis. The central mesenteric venous structures, however, appear patent. No ascites. No free air. VASCULATURE: Aorta is normal in caliber. LYMPH NODES: No lymphadenopathy. REPRODUCTIVE ORGANS: Status post hysterectomy. There is a rim-enhancing collection of fluid demonstrating punctate locules of intraluminal gas best appreciated adjacent to several surgical clips within the left hemipelvis which appears to communicate with the dehiscent vaginal cuff best appreciated on coronal image 53-56. The collection measures roughly 1.6 x 2.2 x 6.4 cm. Volume calculation: 1.6 x 2.2 x 6.4 x 0.52 = 11.66 cm. No adnexal masses. BONES AND SOFT TISSUES: No acute osseous abnormality. No focal soft tissue abnormality. IMPRESSION: 1. Rim-enhancing fluid collection with gas locules in the left hemipelvis, measuring approximately 1.6 x 2.2 x 6.4 cm (calculated volume 11.7 mL), communicating with the dehiscent vaginal cuff, concerning for abscess. 2. Diffuse infiltration of  the mesenteric fat, which may reflect mesenteric edema or inflammatory changes seen with peritonitis; central mesenteric venous structures are patent Electronically signed by: Dorethia Molt MD 04/23/2024 10:34 PM EST RP Workstation: [id number]    EKG/Medicine tests: Not indicated EKG Interpretation:                  Interventions: Morphine , Zosyn  See the EMR for full details regarding lab and imaging  results.  Patient presents for vaginal bleeding and abdominal pain.  Review of patient's chart does not specifically state whether patient had a total versus partial hysterectomy, however patient believes that she had a complete hysterectomy including removal of the cervix.  Labs were obtained in triage and overall reassuring, patient's abdomen is nonperitoneal neck, however patient is adamant that bleeding is from vagina rather than hematuria, which raises concern for postoperative complication and/or vaginal cuff injury.  Considered pelvic examination, however will defer until after imaging in case there is defect in vaginal cuff.  Patient was given morphine  for pain control.  CT of the abdomen pelvis obtained and unfortunately does demonstrate evidence of intra-abdominal abscess with associated descensus of the vaginal cuff on radiology review.  Reviewed these results with the patient, will obtain blood cultures administer Zosyn given evidence of intra-abdominal abscess.  Given patient had initial operation at [facility], consulted [facility]ne[facility], spoke with Dr. Thersia Naegeli who accepted the patient to her service.  Transportation was arranged, patient remained hemodynamically stable while under my care, EMTALA completed, patient reassessed just prior to transfer with [facility].  Presentation is most consistent with acute complicated illness and Current presentation is complicated by underlying chronic conditions  Discussion of management or test interpretations with external provider(s): Dr. Thersia Naegeli, gynecology  Risk Drugs:Prescription drug management and Parenteral controlled substances Treatment: Decision regarding hospitalization  Disposition: Transfer via transport for admission to [facility] gynecology  MDM generated using voice dictation software and may contain dictation errors.  Please contact me for any clarification or with any questions.  Clinical Impression:  1. Dehiscence of  vaginal cuff, initial encounter      Transfer via Transport   Final Clinical Impression(s) / ED Diagnoses Final diagnoses:  Dehiscence of vaginal cuff, initial encounter    Rx / DC Orders ED Discharge Orders     None        Rogelia Jerilynn RAMAN, MD 04/24/24 [phone]

## 2024-04-23 NOTE — ED Notes (Signed)
 Patient transported to CT

## 2024-04-24 ENCOUNTER — Ambulatory Visit (HOSPITAL_COMMUNITY): Payer: Self-pay

## 2024-04-24 MED ORDER — MORPHINE SULFATE (PF) 4 MG/ML IV SOLN
4.0000 mg | Freq: Once | INTRAVENOUS | Status: AC
  Administered 2024-04-24: 4 mg via INTRAVENOUS
  Filled 2024-04-24: qty 1

## 2024-04-24 MED ORDER — MORPHINE SULFATE (PF) 2 MG/ML IV SOLN: 2.0000 mg | Freq: Once | INTRAVENOUS | Status: DC

## 2024-04-24 NOTE — H&P (Signed)
 History and Physical  Patient: Audrey Dougherty, Audrey Dougherty DOB: 1985/09/29 CHART#: [id number] DATE OF SERVICE: April 24, 2024    CC: vaginal bleeding  Subjective Audrey Dougherty is a 38 y.o. [phone] who is 8 weeks post-op from a cesarean hysterectomy ([facility]) due to placenta accreta who presented to outside ED for vaginal bleeding.  She reports she was moving furniture on 11/14 (including lifting heavy dressers) and noted onset of light vaginal bleeding that evening.  She put a pad on overnight and it did not saturate.  She noted increased bleeding and pain on Sunday, so she came to the ED.  Of note, she resumed intercourse 1 week ago.  She had been having normal bowel and bladder function.  She denies fever.    Outside CT was read as 1.6 x 2.2 x 6.4cm abscess in left hemipelvis w/ communication to dehiscent vaginal cuff.  Also, diffuse infiltration of mesenteric fat (mesenteric edema or inflammatory changes seen with peritonitis)  She was seen in our office for a 1 week post-op incision check.  She was scheduled for her postpartum visit on 04/25/24.   She is currently breastfeeding.      Review of Systems Denies fever, chills, chest pain, shortness of breath, N/V/D, dysuria.    Patient Active Problem List   Diagnosis Date Noted  . Pelvic abscess in female 04/24/2024  . C-Hyst 02/28/24, female 02/28/2024  . Antepartum hemorrhage from placenta previa (*) 02/26/2024  . Red blood cell antibody positive 02/13/2024    Anti-E   . Placenta accreta in third trimester (*) 02/10/2024  . Anemia in pregnancy (*) 01/07/2024  . Maternal atypical antibody (*) 08/26/2023    Anti-E antibody FOB ([facility] 04/16/1973) is negative for E antigen, so baby is not at risk   . History of preterm delivery 08/25/2023    08/24/23 Prog 28.7 @ [redacted]w[redacted]d; Plan Prog (zone 3)   . Anxiety and depression 08/24/2023  . HSV-2 infection 08/24/2023  . History of substance abuse (*) 08/24/2023  . AKI (acute kidney  injury) 10/30/2019  . Cocaine overdose (*) 10/30/2019  . Drug overdose 10/30/2019  . Elevated AST (SGOT) 10/30/2019  . Elevated troponin 10/30/2019  . Hyperglycemia 10/30/2019  . Hyperkalemia 10/30/2019  . Lactic acidosis 10/30/2019  . Leukocytosis 10/30/2019  . Microcytic hypochromic anemia 10/30/2019  . Thrombocytosis 10/30/2019  . Status post repeat low transverse cesarean section 05/05/2017    OB History  Gravida Para Term Preterm AB Living  4 4 2 2  0 4  SAB IAB Ectopic Multiple Live Births  0 0 0 0 4    # Outcome Date GA Lbr Len/2nd Weight Sex Type Anes PTL Lv  4 Preterm 02/28/24 [redacted]w[redacted]d  2.11 kg (4 lb 10.4 oz) F CS-Classical Spinal N LIV     Complications: Placenta previa (*)  3 Term 2019 [redacted]w[redacted]d  2.608 kg (5 lb 12 oz) M CS-LTranv Spinal N LIV  2 Term 11/29/12 [redacted]w[redacted]d  2.835 kg (6 lb 4 oz) M CS-LTranv Spinal  LIV  1 Preterm 01/03/08 [redacted]w[redacted]d  2.381 kg (5 lb 4 oz) M CS-LTranv Spinal  LIV     Complications: Placenta Previa      Past Medical History:  Diagnosis Date  . Advanced maternal age in multigravida (*)   . Anxiety and depression   . Gestational diabetes (*) 2009   with G1  . History of alcohol abuse   . History of drug abuse (*)   . History of gestational diabetes  G1  . History of placenta previa    G1  . History of transfusion 2021   after D & C    Past Surgical History:  Procedure Laterality Date  . Cesarean section, low transverse     x3  . Dilation and curettage of uterus  2021    Social History[1]  No family history on file.  Immunization History  Administered Date(s) Administered  . HPV Quadrivalent 11/10/2005, 01/19/2006  . Hepatitis B, unspecified formulation 03/07/1999, 04/30/1999, 09/23/1999  . Meningococcal Con - Menactra 10/07/2005  . Pfizer-BioNTech COVID-19 Monovalent Vaccine mRNA 11/07/2019, 12/19/2019  . RSV BV RSVpreF DILUENT  RECON 0.5 ML, PF 02/21/2024  . Tdap 10/07/2005, 02/17/2017, 02/08/2024    Current Medications[2]    Allergies[3]     Objective BP 118/83   Pulse 86   Temp 98.3 F (36.8 C)   LMP 06/30/2023   SpO2 99%   General: Alert, no acute distress CV:  RRR Lungs:  CTAB, no wheezes, no rhonchi Abd:  soft, is tender throughout with guarding, no rebound Extremities:  No calf tenderness Neuro: normal movements   RN present as chaperone GU: normal female external genitalia, urethra normal in appearance, vagina moist without lesions, cuff is high, there is one white area, small amount of thin serosanguinous fluid noted , NO bowel visualized Bimanual: cuff overall intact with one divot (<1cm) toward left, tenderness with cuff palpation     Impression/Plan Audrey Dougherty is a 38 y.o. [id number] who is 8 weeks post-op from cesarean hysterectomy here for vaginal bleeding #. Post-hysterectomy vaginal bleeding  - Outside CT - 1.6 x 2.2 x 6.4cm left sided pelvic abscess communicating w/ dehiscent cuff, diffuce infiltration of mesenteric fat  - IR consult for possible drainage (discussed w/ PA Williams) --> will check cell count, culture, creatinine to r/o urinoma given left sided nature of abscess  - Continue zosyn (started at outside ED)  - WBC 11.8 --> repeat here  - Discussed w/ gyn-oncology Karlynn), who agrees with plan to start abx and consult IR for percutaneous drainage     Audrey LOISE Naegeli, MD         [1] Social History Socioeconomic History  . Marital status: Single  Tobacco Use  . Smoking status: Former    Current packs/day: 0.25    Average packs/day: 0.3 packs/day for 11.9 years (3.0 ttl pk-yrs)    Types: Cigarettes    Start date: 2014  . Smokeless tobacco: Never  Vaping Use  . Vaping status: Former  Substance and Sexual Activity  . Alcohol use: Not Currently    Comment: last drink 03/2023. In recovery  . Drug use: Not Currently    Types: Crack cocaine, Cocaine, Oxycodone , MDMA (Ecstacy), Marijuana    Comment: Relapsed 11/15/23. UDS- 02/27/24. Prior last use  03/2023 (In recovery)  . Sexual activity: Yes    Partners: Male    Birth control/protection: None  [2]  Current Facility-Administered Medications:  .  acetaminophen  (TYLENOL ) tablet 650 mg, 650 mg, Oral, Q6H WA, Audrey LOISE Naegeli, MD .  buPROPion (WELLBUTRIN SR) 12 hr tablet 150 mg, 150 mg, Oral, Daily, Audrey LOISE Naegeli, MD .  D5 - 0.9% NaCl + KCl 20 mEq/L infusion, 125 mL/hr, IntraVENous, Continuous, Audrey LOISE Naegeli, MD .  docusate sodium  (COLACE,DOK,DOCQLACE) capsule 100 mg, 100 mg, Oral, BID PRN, Audrey LOISE Naegeli, MD .  famotidine (PEPCID) tablet 20 mg, 20 mg, Oral, BID PRN, Audrey LOISE Naegeli, MD .  HYDROmorphone  (DILAUDID ) injection 0.5 mg, 0.5  mg, IntraVENous, Q3H PRN, Audrey LOISE Naegeli, MD .  ibuprofen  (ADVIL ,MOTRIN ) tablet 600 mg, 600 mg, Oral, Q6H PRN, Audrey LOISE Naegeli, MD .  metoclopramide  (REGLAN ) injection 10 mg, 10 mg, IntraVENous, Q6H PRN, Audrey LOISE Naegeli, MD .  ondansetron  (ZOFRAN ) injection 4 mg, 4 mg, IntraVENous, Q4H PRN, Audrey LOISE Naegeli, MD .  ondansetron  (ZOFRAN ) tablet 4 mg, 4 mg, Oral, Q4H PRN, Audrey LOISE Naegeli, MD .  oxyCODONE  HCl (ROXICODONE ) immediate release tablet 10 mg, 10 mg, Oral, Q6H PRN, Audrey LOISE Naegeli, MD .  oxyCODONE  HCl (ROXICODONE ) immediate release tablet 5 mg, 5 mg, Oral, Q6H PRN, Audrey LOISE Naegeli, MD .  piperacillin-tazobactam (ZOSYN) 4.5 g in NaCl 0.9% 100 mL addEASE, 4.5 g, IntraVENous, Q8H SCH, Audrey LOISE Naegeli, MD .  prenatal vitamin 27-0.8 mg 1 tablet, 1 tablet, Oral, Daily, Audrey LOISE Naegeli, MD .  QUEtiapine fumarate (SEROQUEL) tablet 200 mg, 200 mg, Oral, HS, Audrey LOISE Naegeli, MD [3] No Known Allergies

## 2024-04-29 LAB — CULTURE, BLOOD (ROUTINE X 2)
Culture: NO GROWTH
Culture: NO GROWTH
Special Requests: ADEQUATE
Special Requests: ADEQUATE
